# Patient Record
Sex: Male | Born: 2000
Health system: Southern US, Community
[De-identification: ages and names within clinical notes are randomized; demographics above are authoritative.]

## PROBLEM LIST (undated history)

## (undated) DIAGNOSIS — J45909 Unspecified asthma, uncomplicated: Secondary | ICD-10-CM

## (undated) DIAGNOSIS — A048 Other specified bacterial intestinal infections: Secondary | ICD-10-CM

## (undated) DIAGNOSIS — G43909 Migraine, unspecified, not intractable, without status migrainosus: Secondary | ICD-10-CM

## (undated) HISTORY — PX: TONSILLECTOMY: SUR1361

---
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
## 2019-03-13 ENCOUNTER — Other Ambulatory Visit: Payer: Self-pay

## 2019-03-13 ENCOUNTER — Encounter (HOSPITAL_BASED_OUTPATIENT_CLINIC_OR_DEPARTMENT_OTHER): Payer: Self-pay | Admitting: *Deleted

## 2019-03-13 ENCOUNTER — Emergency Department (HOSPITAL_BASED_OUTPATIENT_CLINIC_OR_DEPARTMENT_OTHER)
Admission: EM | Admit: 2019-03-13 | Discharge: 2019-03-13 | Disposition: A | Discharge status: Home / Self Care | Payer: 59 | Attending: Emergency Medicine | Admitting: Emergency Medicine

## 2019-03-13 DIAGNOSIS — E860 Dehydration: Secondary | ICD-10-CM | POA: Diagnosis not present

## 2019-03-13 DIAGNOSIS — U071 COVID-19: Secondary | ICD-10-CM | POA: Insufficient documentation

## 2019-03-13 DIAGNOSIS — Z79899 Other long term (current) drug therapy: Secondary | ICD-10-CM | POA: Insufficient documentation

## 2019-03-13 DIAGNOSIS — G43109 Migraine with aura, not intractable, without status migrainosus: Secondary | ICD-10-CM | POA: Diagnosis not present

## 2019-03-13 DIAGNOSIS — R112 Nausea with vomiting, unspecified: Secondary | ICD-10-CM | POA: Diagnosis not present

## 2019-03-13 LAB — URINALYSIS, ROUTINE W REFLEX MICROSCOPIC
Bilirubin Urine: NEGATIVE
Glucose, UA: NEGATIVE mg/dL
Hgb urine dipstick: NEGATIVE
Ketones, ur: 15 mg/dL — AB
Leukocytes,Ua: NEGATIVE
Nitrite: NEGATIVE
Protein, ur: NEGATIVE mg/dL
Specific Gravity, Urine: >1.03 — ABNORMAL HIGH (ref 1.005–1.030)
pH: 6 (ref 5.0–8.0)

## 2019-03-13 LAB — CBC WITH DIFFERENTIAL/PLATELET
Abs Immature Granulocytes: 0.02 10*3/uL (ref 0.00–0.07)
Basophils Absolute: 0 10*3/uL (ref 0.0–0.1)
Basophils Relative: 0 %
Eosinophils Absolute: 0.2 10*3/uL (ref 0.0–0.5)
Eosinophils Relative: 2 %
HCT: 39.3 % (ref 39.0–52.0)
Hemoglobin: 12.7 g/dL — ABNORMAL LOW (ref 13.0–17.0)
Immature Granulocytes: 0 %
Lymphocytes Relative: 19 %
Lymphs Abs: 1.6 10*3/uL (ref 0.7–4.0)
MCH: 27.8 pg (ref 26.0–34.0)
MCHC: 32.3 g/dL (ref 30.0–36.0)
MCV: 86 fL (ref 80.0–100.0)
Monocytes Absolute: 1 10*3/uL (ref 0.1–1.0)
Monocytes Relative: 12 %
Neutro Abs: 5.9 10*3/uL (ref 1.7–7.7)
Neutrophils Relative %: 67 %
Platelets: 213 10*3/uL (ref 150–400)
RBC: 4.57 MIL/uL (ref 4.22–5.81)
RDW: 12.1 % (ref 11.5–15.5)
WBC: 8.7 10*3/uL (ref 4.0–10.5)
nRBC: 0 % (ref 0.0–0.2)

## 2019-03-13 LAB — COMPREHENSIVE METABOLIC PANEL
ALT: 11 U/L (ref 0–44)
AST: 16 U/L (ref 15–41)
Albumin: 4.8 g/dL (ref 3.5–5.0)
Alkaline Phosphatase: 74 U/L (ref 38–126)
Anion gap: 10 (ref 5–15)
BUN: 14 mg/dL (ref 6–20)
CO2: 27 mmol/L (ref 22–32)
Calcium: 9.6 mg/dL (ref 8.9–10.3)
Chloride: 101 mmol/L (ref 98–111)
Creatinine, Ser: 0.86 mg/dL (ref 0.61–1.24)
GFR calc Af Amer: >60 mL/min (ref >60)
GFR calc non Af Amer: >60 mL/min (ref >60)
Glucose, Bld: 101 mg/dL — ABNORMAL HIGH (ref 70–99)
Potassium: 3.6 mmol/L (ref 3.5–5.1)
Sodium: 138 mmol/L (ref 135–145)
Total Bilirubin: 0.8 mg/dL (ref 0.3–1.2)
Total Protein: 7.6 g/dL (ref 6.5–8.1)

## 2019-03-13 LAB — SEDIMENTATION RATE: Sed Rate: 8 mm/hr (ref 0–16)

## 2019-03-13 LAB — RAPID URINE DRUG SCREEN, HOSP PERFORMED
Amphetamines: NOT DETECTED
Barbiturates: NOT DETECTED
Benzodiazepines: NOT DETECTED
Cocaine: NOT DETECTED
Opiates: NOT DETECTED
Tetrahydrocannabinol: NOT DETECTED

## 2019-03-13 LAB — GROUP A STREP BY PCR: Group A Strep by PCR: NOT DETECTED

## 2019-03-13 MED ORDER — MAGNESIUM SULFATE 2 GM/50ML IV SOLN
2.0000 g | Freq: Once | INTRAVENOUS | Status: AC
  Administered 2019-03-13: 2 g via INTRAVENOUS
  Filled 2019-03-13: qty 50

## 2019-03-13 MED ORDER — ONDANSETRON HCL 4 MG/2ML IJ SOLN
4.0000 mg | Freq: Once | INTRAMUSCULAR | Status: AC
  Administered 2019-03-13: 4 mg via INTRAVENOUS
  Filled 2019-03-13: qty 2

## 2019-03-13 MED ORDER — LACTATED RINGERS IV BOLUS
1000.0000 mL | Freq: Once | INTRAVENOUS | Status: AC
  Administered 2019-03-13: 1000 mL via INTRAVENOUS

## 2019-03-13 MED ORDER — SODIUM CHLORIDE 0.9 % IV SOLN
1000.0000 mL | INTRAVENOUS | Status: DC
  Administered 2019-03-13: 1000 mL via INTRAVENOUS

## 2019-03-13 MED ORDER — ONDANSETRON 4 MG PO TBDP: 4.0000 mg | ORAL_TABLET | Freq: Three times a day (TID) | ORAL | 0 refills | Status: DC | PRN

## 2019-03-13 MED ORDER — DIPHENHYDRAMINE HCL 50 MG/ML IJ SOLN
25.0000 mg | Freq: Once | INTRAMUSCULAR | Status: AC
  Administered 2019-03-13: 25 mg via INTRAVENOUS
  Filled 2019-03-13: qty 1

## 2019-03-13 MED ORDER — METOCLOPRAMIDE HCL 5 MG/ML IJ SOLN
10.0000 mg | Freq: Once | INTRAMUSCULAR | Status: AC
  Administered 2019-03-13: 10 mg via INTRAVENOUS
  Filled 2019-03-13: qty 2

## 2019-03-13 MED ORDER — SODIUM CHLORIDE 0.9 % IV BOLUS
1000.0000 mL | Freq: Once | INTRAVENOUS | Status: AC
  Administered 2019-03-13: 1000 mL via INTRAVENOUS

## 2019-03-13 MED ORDER — DEXAMETHASONE SODIUM PHOSPHATE 10 MG/ML IJ SOLN
10.0000 mg | Freq: Once | INTRAMUSCULAR | Status: AC
  Administered 2019-03-13: 10 mg via INTRAVENOUS
  Filled 2019-03-13: qty 1

## 2019-03-13 MED ORDER — SODIUM CHLORIDE 0.9 % IV BOLUS (SEPSIS)
1000.0000 mL | Freq: Once | INTRAVENOUS | Status: AC
  Administered 2019-03-13: 1000 mL via INTRAVENOUS

## 2019-03-13 MED ORDER — KETOROLAC TROMETHAMINE 30 MG/ML IJ SOLN
30.0000 mg | Freq: Once | INTRAMUSCULAR | Status: AC
  Administered 2019-03-13: 13:00:00 30 mg via INTRAVENOUS
  Filled 2019-03-13: qty 1

## 2019-03-13 NOTE — ED Provider Notes (Signed)
Pt was signed out by Dr. Johnney Killian pending symptomatic improvement.  Pt is finally feeling better after 3rd L of NS.  He is stable for d/c.  Return if worse.   Isla Pence, MD 03/13/19 1715

## 2019-03-13 NOTE — ED Triage Notes (Addendum)
Woke up yesterday with headache and constant pressure to epigastric area.    Has a history of migraine, took Ibu, Tylenol, Pepto bismol and gas-x with no relief. Vomited x4.  Tested positive for Covid in the end of June.

## 2019-03-13 NOTE — ED Provider Notes (Signed)
[facility] EMERGENCY DEPARTMENT Provider Note   CSN: [id number] Arrival date & time: 03/13/19  1043     History   Chief Complaint Chief Complaint  Patient presents with  . Migraine  . vomiting    HPI Devin Robinson is a 18 y.o. male.     HPI Patient reports he has a history of migraine headaches.  He reports he has gotten them sporadically for about 2 years now.  He reports he has a headache that started yesterday.  He reports that the severe pressure and throbbing through his forehead and temples.  He reports he has light sensitivity to his eyes which he commonly has with that.  He reports he is very nauseated as well.  Patient reports he also has pretty intense pressure in his epigastrium which does not typically occur with his migraines.  He reports it feels like somebody is putting a lot of pressure in his upper stomach.  He reports he is vomited about 4 times.  Reports if he tries to drink anything or take anything he ends up throwing up.  He denies he has had any fever with this.  He denies he has sore throat.  I do note ulcers on his lips.  He reports that he has had these on and off chronically since childhood.  He reports they are not as bad right now as they get sometimes.  Patient reports he did test positive for coronavirus about 4 weeks ago.  He reports he had some symptoms of achiness and fatigue and loss of taste.  He reports his symptoms however to completely resolved and he felt well and back to normal for 2 weeks up until the time that things started yesterday. Past Medical History:  Diagnosis Date  . Asthma     There are no active problems to display for this patient.   Past Surgical History:  Procedure Laterality Date  . TONSILLECTOMY          Home Medications    Prior to Admission medications   Medication Sig Start Date End Date Taking? Authorizing Provider  ergocalciferol (VITAMIN D2) 1.25 MG (50000 UT) capsule Take 50,000 Units by mouth  once a week.   Yes [provider]    Family History No family history on file.  Social History Social History   Tobacco Use  . Smoking status: Never Smoker  . Smokeless tobacco: Never Used  Substance Use Topics  . Alcohol use: No  . Drug use: Never     Allergies   Patient has no known allergies.   Review of Systems Review of Systems 10 Systems reviewed and are negative for acute change except as noted in the HPI.   Physical Exam Updated Vital Signs BP (!) 146/81 (BP Location: Right Arm)   Pulse 93   Temp (!) 97.5 F (36.4 C) (Oral)   Resp 16   Ht 5\' 10"  (1.778 m)   Wt 85.2 kg   SpO2 100%   BMI 26.96 kg/m   Physical Exam Constitutional:      Comments: Patient is alert and nontoxic.  He is keeping his sweatshirt over his eyes when I turn the lights on.  His mental status is clear.  He has no respiratory distress.  Well-nourished well-developed.  HENT:     Head: Normocephalic and atraumatic.     Right Ear: External ear normal.     Left Ear: External ear normal.     Nose: Nose normal.  Mouth/Throat:     Comments: Posterior oropharynx is patent.  No exudates on the tonsils.  Patient does have shallow ulcerative lesions on the lower lip.  There is a little cluster about 2 or 3 of them on the mucosal surface.  He does not have vesicular lesions.  There is also one small several millimeter shallow erosion on the tip of the tongue. Eyes:     Extraocular Movements: Extraocular movements intact.     Conjunctiva/sclera: Conjunctivae normal.     Pupils: Pupils are equal, round, and reactive to light.  Neck:     Musculoskeletal: Neck supple. No neck rigidity or muscular tenderness.  Cardiovascular:     Rate and Rhythm: Normal rate and regular rhythm.  Pulmonary:     Effort: Pulmonary effort is normal.     Breath sounds: Normal breath sounds.  Abdominal:     Comments: Abdomen is soft.  Patient feels discomfort to palpation in the epigastrium and upper  abdomen.  He does not have guarding or hepatosplenomegaly.  Lower abdomen nontender.  Musculoskeletal: Normal range of motion.        General: No swelling or tenderness.     Right lower leg: No edema.     Left lower leg: No edema.  Lymphadenopathy:     Cervical: No cervical adenopathy.  Skin:    General: Skin is warm and dry.     Findings: No rash.  Neurological:     General: No focal deficit present.     Mental Status: He is oriented to person, place, and time.     Coordination: Coordination normal.  Psychiatric:        Mood and Affect: Mood normal.      ED Treatments / Results  Labs (all labs ordered are listed, but only abnormal results are displayed) Labs Reviewed  NOVEL CORONAVIRUS, NAA (HOSPITAL ORDER, SEND-OUT TO REF LAB)  GROUP A STREP BY PCR  COMPREHENSIVE METABOLIC PANEL  CBC WITH DIFFERENTIAL/PLATELET  URINALYSIS, ROUTINE W REFLEX MICROSCOPIC  RAPID URINE DRUG SCREEN, HOSP PERFORMED  SEDIMENTATION RATE    EKG None  Radiology No results found.  Procedures Procedures (including critical care time)  Medications Ordered in ED Medications  sodium chloride 0.9 % bolus 1,000 mL (has no administration in time range)    Followed by  0.9 %  sodium chloride infusion (has no administration in time range)  ketorolac (TORADOL) 30 MG/ML injection 30 mg (has no administration in time range)  ondansetron (ZOFRAN) injection 4 mg (has no administration in time range)  ondansetron (ZOFRAN) injection 4 mg (4 mg Intravenous Given 03/13/19 1202)     Initial Impression / Assessment and Plan / ED Course  I have reviewed the triage vital signs and the nursing notes.  Pertinent labs & imaging results that were available during my care of the patient were reviewed by me and considered in my medical decision making (see chart for details).        Diagnostic evaluation unremarkable.  Patient is clinically well but continues to have recurrent nausea and vomiting.  Patient is  afebrile.  He did have COVID apparently 4 weeks ago but completely resolved symptoms.  I have low suspicion this is recurrence of COVID.  Patient does not have cough or shortness of breath.  He does not have general body aches or fever or meningismus.  Identifies symptoms is typical of migraine headaches that he has gotten previously.  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  I give some consideration to Behcet's syndrome as they do not appear herpetic.  Patient denies however other systemic type symptoms that would be more supportive. We will continue with hydration and migraine therapy.  Will need reassessment for final disposition.  If does not improve may need admission for symptomatic treatment.  Final Clinical Impressions(s) / ED Diagnoses   Final diagnoses:  None    ED Discharge Orders    None       Charlesetta Shanks, MD 03/13/19 1557

## 2019-03-15 LAB — NOVEL CORONAVIRUS, NAA (HOSP ORDER, SEND-OUT TO REF LAB; TAT 18-24 HRS): SARS-CoV-2, NAA: DETECTED — AB

## 2019-10-31 ENCOUNTER — Encounter (HOSPITAL_BASED_OUTPATIENT_CLINIC_OR_DEPARTMENT_OTHER): Payer: Self-pay | Admitting: Emergency Medicine

## 2019-10-31 ENCOUNTER — Other Ambulatory Visit: Payer: Self-pay

## 2019-10-31 ENCOUNTER — Emergency Department (HOSPITAL_BASED_OUTPATIENT_CLINIC_OR_DEPARTMENT_OTHER): Payer: 59

## 2019-10-31 ENCOUNTER — Emergency Department (HOSPITAL_BASED_OUTPATIENT_CLINIC_OR_DEPARTMENT_OTHER)
Admission: EM | Admit: 2019-10-31 | Discharge: 2019-10-31 | Disposition: A | Discharge status: Home / Self Care | Payer: 59 | Attending: Emergency Medicine | Admitting: Emergency Medicine

## 2019-10-31 DIAGNOSIS — Y999 Unspecified external cause status: Secondary | ICD-10-CM | POA: Insufficient documentation

## 2019-10-31 DIAGNOSIS — Y9367 Activity, basketball: Secondary | ICD-10-CM | POA: Diagnosis not present

## 2019-10-31 DIAGNOSIS — Y92310 Basketball court as the place of occurrence of the external cause: Secondary | ICD-10-CM | POA: Diagnosis not present

## 2019-10-31 DIAGNOSIS — S99911A Unspecified injury of right ankle, initial encounter: Secondary | ICD-10-CM | POA: Diagnosis present

## 2019-10-31 DIAGNOSIS — X509XXA Other and unspecified overexertion or strenuous movements or postures, initial encounter: Secondary | ICD-10-CM | POA: Insufficient documentation

## 2019-10-31 DIAGNOSIS — S93491A Sprain of other ligament of right ankle, initial encounter: Secondary | ICD-10-CM | POA: Insufficient documentation

## 2019-10-31 DIAGNOSIS — J45909 Unspecified asthma, uncomplicated: Secondary | ICD-10-CM | POA: Diagnosis not present

## 2019-10-31 MED ORDER — IBUPROFEN 800 MG PO TABS: 800.0000 mg | ORAL_TABLET | Freq: Three times a day (TID) | ORAL | 0 refills | Status: DC

## 2019-10-31 MED ORDER — ACETAMINOPHEN 500 MG PO TABS
1000.0000 mg | ORAL_TABLET | Freq: Once | ORAL | Status: AC
  Administered 2019-10-31: 23:00:00 1000 mg via ORAL
  Filled 2019-10-31: qty 2

## 2019-10-31 MED ORDER — IBUPROFEN 800 MG PO TABS
800.0000 mg | ORAL_TABLET | Freq: Once | ORAL | Status: AC
  Administered 2019-10-31: 800 mg via ORAL
  Filled 2019-10-31: qty 1

## 2019-10-31 NOTE — ED Triage Notes (Signed)
Felt a pop in right ankle playing basketball just PTA. Right ankle swollen, skin intact.Pain with bearing weight.

## 2019-10-31 NOTE — ED Provider Notes (Signed)
[facility] EMERGENCY DEPARTMENT Provider Note   CSN: [id number] Arrival date & time: 10/31/19  2250     History Chief Complaint  Patient presents with  . Ankle Injury    Devin Robinson is a 19 y.o. male.  The history is provided by the patient.  Ankle Injury This is a new problem. The current episode started less than 1 hour ago. The problem occurs constantly. The problem has not changed since onset.Pertinent negatives include no chest pain, no abdominal pain, no headaches and no shortness of breath. Nothing aggravates the symptoms. Nothing relieves the symptoms. He has tried nothing for the symptoms. The treatment provided no relief.       Past Medical History:  Diagnosis Date  . Asthma     There are no problems to display for this patient.   Past Surgical History:  Procedure Laterality Date  . TONSILLECTOMY         History reviewed. No pertinent family history.  Social History   Tobacco Use  . Smoking status: Never Smoker  . Smokeless tobacco: Never Used  Substance Use Topics  . Alcohol use: No  . Drug use: Never    Home Medications Prior to Admission medications   Medication Sig Start Date End Date Taking? Authorizing Provider  albuterol (VENTOLIN HFA) 108 (90 Base) MCG/ACT inhaler Inhale into the lungs. 05/22/19  Yes [provider]  ergocalciferol (VITAMIN D2) 1.25 MG (50000 UT) capsule Take 50,000 Units by mouth once a week.    [provider]  ibuprofen (ADVIL) 800 MG tablet Take 1 tablet (800 mg total) by mouth 3 (three) times daily. 10/31/19   Ramie Palladino, MD  ondansetron (ZOFRAN ODT) 4 MG disintegrating tablet Take 1 tablet (4 mg total) by mouth every 8 (eight) hours as needed. 03/13/19   Isla Pence, MD    Allergies    Patient has no known allergies.  Review of Systems   Review of Systems  Constitutional: Negative for fever.  HENT: Negative for congestion.   Eyes: Negative for visual disturbance.    Respiratory: Negative for shortness of breath.   Cardiovascular: Negative for chest pain.  Gastrointestinal: Negative for abdominal pain.  Genitourinary: Negative for difficulty urinating.  Musculoskeletal: Positive for arthralgias.  Neurological: Negative for headaches.  Psychiatric/Behavioral: Negative for agitation.  All other systems reviewed and are negative.   Physical Exam Updated Vital Signs BP (!) 151/77 (BP Location: Right Arm)   Pulse 88   Temp 98.2 F (36.8 C) (Oral)   Resp 20   Ht 5\' 10"  (1.778 m)   Wt 88.5 kg   SpO2 99%   BMI 27.98 kg/m   Physical Exam Vitals and nursing note reviewed.  Constitutional:      General: He is not in acute distress.    Appearance: Normal appearance.  HENT:     Head: Normocephalic and atraumatic.     Nose: Nose normal.  Eyes:     Conjunctiva/sclera: Conjunctivae normal.     Pupils: Pupils are equal, round, and reactive to light.  Cardiovascular:     Rate and Rhythm: Normal rate and regular rhythm.     Pulses: Normal pulses.     Heart sounds: Normal heart sounds.  Pulmonary:     Effort: Pulmonary effort is normal.     Breath sounds: Normal breath sounds.  Abdominal:     General: Abdomen is flat. Bowel sounds are normal.     Tenderness: There is no abdominal tenderness. There is no  guarding.  Musculoskeletal:        General: Normal range of motion.     Cervical back: Normal range of motion and neck supple.     Right lower leg: Normal.     Right ankle: Normal.     Right Achilles Tendon: Normal.     Left ankle: Normal.     Left Achilles Tendon: Normal.     Right foot: Normal.     Left foot: Normal.  Skin:    General: Skin is warm and dry.     Capillary Refill: Capillary refill takes less than 2 seconds.  Neurological:     General: No focal deficit present.     Mental Status: He is alert and oriented to person, place, and time.     Deep Tendon Reflexes: Reflexes normal.  Psychiatric:        Mood and Affect: Mood  normal.        Behavior: Behavior normal.     ED Results / Procedures / Treatments   Labs (all labs ordered are listed, but only abnormal results are displayed) Labs Reviewed - No data to display  EKG None  Radiology DG Ankle Complete Right  Result Date: 10/31/2019 CLINICAL DATA:  19 year old male with right ankle pain. EXAM: RIGHT ANKLE - COMPLETE 3+ VIEW COMPARISON:  None. FINDINGS: There is no acute fracture or dislocation. The bones are well mineralized. No arthritic change. The ankle mortise is intact. There is soft tissue swelling over the lateral malleolus. No radiopaque foreign object or soft tissue gas. IMPRESSION: No acute osseous pathology. Electronically Signed   By: Elgie Collard M.D.   On: 10/31/2019 23:25    Procedures Procedures (including critical care time)  Medications Ordered in ED Medications  acetaminophen (TYLENOL) tablet 1,000 mg (1,000 mg Oral Given 10/31/19 2320)  ibuprofen (ADVIL) tablet 800 mg (800 mg Oral Given 10/31/19 2320)    ED Course  I have reviewed the triage vital signs and the nursing notes.  Pertinent labs & imaging results that were available during my care of the patient were reviewed by me and considered in my medical decision making (see chart for details).    Sprain.  Ice elevation and NSAIDs.  ACE wrap applied in the department.  Wear a tight fitting shoe and elevate the leg when not on it.    Devin Robinson was evaluated in Emergency Department on 10/31/2019 for the symptoms described in the history of present illness. He was evaluated in the context of the global COVID-19 pandemic, which necessitated consideration that the patient might be at risk for infection with the SARS-CoV-2 virus that causes COVID-19. Institutional protocols and algorithms that pertain to the evaluation of patients at risk for COVID-19 are in a state of rapid change based on information released by regulatory bodies including the CDC and federal and state  organizations. These policies and algorithms were followed during the patient's care in the ED.  Final Clinical Impression(s) / ED Diagnoses Final diagnoses:  Sprain of anterior talofibular ligament of right ankle, initial encounter    Return for weakness, numbness, changes in vision or speech, fevers >100.4 unrelieved by medication, shortness of breath, intractable vomiting, or diarrhea, abdominal pain, Inability to tolerate liquids or food, cough, altered mental status or any concerns. No signs of systemic illness or infection. The patient is nontoxic-appearing on exam and vital signs are within normal limits.   I have reviewed the triage vital signs and the nursing notes. Pertinent labs &imaging results  that were available during my care of the patient were reviewed by me and considered in my medical decision making (see chart for details).  After history, exam, and medical workup I feel the patient has been appropriately medically screened and is safe for discharge home. Pertinent diagnoses were discussed with the patient. Patient was given return precautions     Rx / DC Orders ED Discharge Orders         Ordered    ibuprofen (ADVIL) 800 MG tablet  3 times daily     10/31/19 2339           Jabin Tapp, MD 10/31/19 2341

## 2019-11-05 ENCOUNTER — Other Ambulatory Visit: Payer: Self-pay

## 2019-11-06 ENCOUNTER — Encounter: Payer: Self-pay | Admitting: Family Medicine

## 2019-11-06 ENCOUNTER — Ambulatory Visit: Payer: 59 | Admitting: Family Medicine

## 2019-11-06 ENCOUNTER — Other Ambulatory Visit: Payer: Self-pay

## 2019-11-06 VITALS — BP 132/82 | HR 62 | Temp 97.1°F | Ht 70.0 in | Wt 194.2 lb

## 2019-11-06 DIAGNOSIS — G43809 Other migraine, not intractable, without status migrainosus: Secondary | ICD-10-CM | POA: Diagnosis not present

## 2019-11-06 DIAGNOSIS — J45990 Exercise induced bronchospasm: Secondary | ICD-10-CM

## 2019-11-06 MED ORDER — FLUTICASONE PROPIONATE HFA 110 MCG/ACT IN AERO: 2.0000 | INHALATION_SPRAY | Freq: Two times a day (BID) | RESPIRATORY_TRACT | 2 refills | Status: DC

## 2019-11-06 MED ORDER — LEVALBUTEROL TARTRATE 45 MCG/ACT IN AERO: 1.0000 | INHALATION_SPRAY | Freq: Four times a day (QID) | RESPIRATORY_TRACT | 12 refills | Status: DC | PRN

## 2019-11-06 MED ORDER — SUMATRIPTAN SUCCINATE 100 MG PO TABS: 50.0000 mg | ORAL_TABLET | ORAL | 2 refills | Status: DC | PRN

## 2019-11-06 NOTE — Progress Notes (Signed)
Chief Complaint  Patient presents with  . New Patient (Initial Visit)  . Asthma       New Patient Visit SUBJECTIVE: HPI: Devin Robinson is an 19 y.o.male who is being seen for establishing care.  The patient was previously seen at the pediatrician's office.  Patient was diagnosed with exercise-induced asthma.  An albuterol inhaler was sent and that did not help.  He tried using it prophylactically and that also was not helpful.  He has side effects of lightheadedness and headaches with it.  He has never tried anything else.  He denies strong allergies.  No current chest pain, shortness of breath or wheezing.  Patient has a history of migraines.  They appear to be triggered by exercise.  He uses ibuprofen.  He does have a family history of headaches.  Past Medical History:  Diagnosis Date  . Asthma    Past Surgical History:  Procedure Laterality Date  . TONSILLECTOMY     History reviewed. No pertinent family history. No Known Allergies  Current Outpatient Medications:  .  fluticasone (FLOVENT HFA) 110 MCG/ACT inhaler, Inhale 2 puffs into the lungs in the morning and at bedtime. Rinse mouth out after each use., Disp: 1 Inhaler, Rfl: 2 .  levalbuterol (XOPENEX HFA) 45 MCG/ACT inhaler, Inhale 1-2 puffs into the lungs every 6 (six) hours as needed for wheezing or shortness of breath., Disp: 1 Inhaler, Rfl: 12 .  SUMAtriptan (IMITREX) 100 MG tablet, Take 0.5-1 tablets (50-100 mg total) by mouth every 2 (two) hours as needed for migraine. May repeat in 2 hours if headache persists or recurs., Disp: 10 tablet, Rfl: 2  OBJECTIVE: BP 132/82 (BP Location: Left Arm, Patient Position: Sitting, Cuff Size: Normal)   Pulse 62   Temp (!) 97.1 F (36.2 C) (Temporal)   Ht 5\' 10"  (1.778 m)   Wt 194 lb 4 oz (88.1 kg)   SpO2 98%   BMI 27.87 kg/m  General:  well developed, well nourished, in no apparent distress Skin:  no significant moles, warts, or growths Lungs:  clear to auscultation,  breath sounds equal bilaterally, no respiratory distress Cardio:  regular rate and rhythm, no LE edema Musculoskeletal:  symmetrical muscle groups noted without atrophy or deformity Neuro:  gait normal, DTRs equal and symmetric throughout, no clonus, no cerebellar signs Psych: well oriented with normal range of affect and appropriate judgment/insight  ASSESSMENT/PLAN: Exercise-induced asthma - Plan: levalbuterol (XOPENEX HFA) 45 MCG/ACT inhaler, fluticasone (FLOVENT HFA) 110 MCG/ACT inhaler  Other migraine without status migrainosus, not intractable - Plan: SUMAtriptan (IMITREX) 100 MG tablet  1-change albuterol to Xopenex.  Add inhaled corticosteroid.  Reminded him to rinse mouth out after each use. 2-trial Imitrex.  Could consider Nurtec. Patient should return in 1 month to recheck. The patient voiced understanding and agreement to the plan.   [place], DO 11/06/19  12:17 PM

## 2019-11-06 NOTE — Patient Instructions (Addendum)
Nice meeting you today.  Use the new rescue inhaler as needed. Consider using 10-15 min prior to exercise  Stay hydrated.   No more ibuprofen before working out/sports.  Try 1/2 tab of the Imitrex first. Can make you drowsy.  Let us know if you need anything.

## 2019-12-19 ENCOUNTER — Ambulatory Visit: Payer: 59 | Admitting: Family Medicine

## 2020-01-28 ENCOUNTER — Other Ambulatory Visit: Payer: Self-pay

## 2020-01-28 ENCOUNTER — Encounter (HOSPITAL_BASED_OUTPATIENT_CLINIC_OR_DEPARTMENT_OTHER): Payer: Self-pay | Admitting: Emergency Medicine

## 2020-01-28 ENCOUNTER — Emergency Department (HOSPITAL_BASED_OUTPATIENT_CLINIC_OR_DEPARTMENT_OTHER)
Admission: EM | Admit: 2020-01-28 | Discharge: 2020-01-28 | Disposition: A | Discharge status: Home / Self Care | Payer: 59 | Attending: Emergency Medicine | Admitting: Emergency Medicine

## 2020-01-28 DIAGNOSIS — J45909 Unspecified asthma, uncomplicated: Secondary | ICD-10-CM | POA: Diagnosis not present

## 2020-01-28 DIAGNOSIS — H53149 Visual discomfort, unspecified: Secondary | ICD-10-CM | POA: Diagnosis not present

## 2020-01-28 DIAGNOSIS — G43809 Other migraine, not intractable, without status migrainosus: Secondary | ICD-10-CM | POA: Insufficient documentation

## 2020-01-28 DIAGNOSIS — Z7952 Long term (current) use of systemic steroids: Secondary | ICD-10-CM | POA: Insufficient documentation

## 2020-01-28 HISTORY — DX: Migraine, unspecified, not intractable, without status migrainosus: G43.909

## 2020-01-28 MED ORDER — KETOROLAC TROMETHAMINE 60 MG/2ML IM SOLN
INTRAMUSCULAR | Status: AC
  Administered 2020-01-28: 30 mg
  Filled 2020-01-28: qty 2

## 2020-01-28 MED ORDER — KETOROLAC TROMETHAMINE 30 MG/ML IJ SOLN: 30.0000 mg | Freq: Once | INTRAMUSCULAR | Status: DC

## 2020-01-28 MED ORDER — SODIUM CHLORIDE 0.9 % IV BOLUS
1000.0000 mL | Freq: Once | INTRAVENOUS | Status: AC
  Administered 2020-01-28: 1000 mL via INTRAVENOUS

## 2020-01-28 MED ORDER — DIPHENHYDRAMINE HCL 50 MG/ML IJ SOLN
25.0000 mg | Freq: Once | INTRAMUSCULAR | Status: AC
  Administered 2020-01-28: 25 mg via INTRAVENOUS
  Filled 2020-01-28: qty 1

## 2020-01-28 MED ORDER — ONDANSETRON HCL 4 MG/2ML IJ SOLN
4.0000 mg | Freq: Once | INTRAMUSCULAR | Status: AC
  Administered 2020-01-28: 4 mg via INTRAVENOUS
  Filled 2020-01-28: qty 2

## 2020-01-28 MED ORDER — METOCLOPRAMIDE HCL 5 MG/ML IJ SOLN
10.0000 mg | Freq: Once | INTRAMUSCULAR | Status: AC
  Administered 2020-01-28: 10 mg via INTRAVENOUS
  Filled 2020-01-28: qty 2

## 2020-01-28 NOTE — ED Notes (Signed)
Pt asleep. RN woke pt to assess HA. Pt denies pain, endorses dizziness. Pts father states he has had similar reaction to meds given tonight in the past that have resolved with sleep.

## 2020-01-28 NOTE — ED Triage Notes (Signed)
Patient presents with complaints of migraine headache; states onset 4 hours pta. States took rx and otc ibuprofen at home with no relief. States vomited x 2 and states photophobia.

## 2020-01-28 NOTE — ED Provider Notes (Signed)
[facility] EMERGENCY DEPARTMENT Provider Note   CSN: [id number] Arrival date & time: 01/28/20  0045     History Chief Complaint  Patient presents with  . Headache    Devin Robinson is a 19 y.o. male.  Patient presents with typical migraine headache that onset about 8 PM.  States headache is bitemporal and retro-orbital and progressively worsening.  He took his sumatriptan at home without relief.  Headache is associated with nausea, photophobia and 2 episodes of vomiting.  Denies any fever, chills.  No focal weakness, numbness or tingling.  No chest pain or shortness of breath.  Denies thunderclap onset.  Similar to previous migraines.  No fever. No recent illnesses.  The history is provided by the patient.  Headache Associated symptoms: nausea, photophobia and vomiting   Associated symptoms: no congestion, no dizziness, no fever, no numbness and no weakness        Past Medical History:  Diagnosis Date  . Asthma   . Migraine     There are no problems to display for this patient.   Past Surgical History:  Procedure Laterality Date  . TONSILLECTOMY         History reviewed. No pertinent family history.  Social History   Tobacco Use  . Smoking status: Never Smoker  . Smokeless tobacco: Never Used  Substance Use Topics  . Alcohol use: No  . Drug use: Never    Home Medications Prior to Admission medications   Medication Sig Start Date End Date Taking? Authorizing Provider  fluticasone (FLOVENT HFA) 110 MCG/ACT inhaler Inhale 2 puffs into the lungs in the morning and at bedtime. Rinse mouth out after each use. 11/06/19   Wendling, Crosby Oyster, DO  levalbuterol [facility] HFA) 45 MCG/ACT inhaler Inhale 1-2 puffs into the lungs every 6 (six) hours as needed for wheezing or shortness of breath. 11/06/19   Wendling, Crosby Oyster, DO  SUMAtriptan (IMITREX) 100 MG tablet Take 0.5-1 tablets (50-100 mg total) by mouth every 2 (two) hours as needed for  migraine. May repeat in 2 hours if headache persists or recurs. 11/06/19   Shelda Pal, DO    Allergies    Patient has no known allergies.  Review of Systems   Review of Systems  Constitutional: Negative for activity change, appetite change and fever.  HENT: Negative for congestion and rhinorrhea.   Eyes: Positive for photophobia.  Respiratory: Negative for chest tightness.   Cardiovascular: Negative for chest pain.  Gastrointestinal: Positive for nausea and vomiting.  Genitourinary: Negative for dysuria and hematuria.  Musculoskeletal: Negative for arthralgias.  Skin: Negative for rash.  Neurological: Positive for headaches. Negative for dizziness, weakness, light-headedness and numbness.   all other systems are negative except as noted in the HPI and PMH.    Physical Exam Updated Vital Signs BP (!) 151/88 (BP Location: Left Arm)   Pulse 82   Temp 97.9 F (36.6 C) (Oral)   Resp 18   Ht 5\' 10"  (1.778 m)   Wt 89.4 kg   SpO2 100%   BMI 28.27 kg/m   Physical Exam Vitals and nursing note reviewed.  Constitutional:      General: He is not in acute distress.    Appearance: He is well-developed.  HENT:     Head: Normocephalic and atraumatic.     Mouth/Throat:     Pharynx: No oropharyngeal exudate.  Eyes:     Conjunctiva/sclera: Conjunctivae normal.     Pupils: Pupils are equal, round, and reactive  to light.     Comments: Photophobic  Neck:     Comments: No meningismus. Cardiovascular:     Rate and Rhythm: Normal rate and regular rhythm.     Heart sounds: Normal heart sounds. No murmur heard.   Pulmonary:     Effort: Pulmonary effort is normal. No respiratory distress.     Breath sounds: Normal breath sounds.  Abdominal:     Palpations: Abdomen is soft.     Tenderness: There is no abdominal tenderness. There is no guarding or rebound.  Musculoskeletal:        General: No tenderness. Normal range of motion.     Cervical back: Normal range of motion and  neck supple. No rigidity.  Skin:    General: Skin is warm.     Capillary Refill: Capillary refill takes less than 2 seconds.  Neurological:     General: No focal deficit present.     Mental Status: He is alert and oriented to person, place, and time. Mental status is at baseline.     Cranial Nerves: No cranial nerve deficit.     Motor: No abnormal muscle tone.     Coordination: Coordination normal.     Comments: No ataxia on finger to nose bilaterally. No pronator drift. 5/5 strength throughout. CN 2-12 intact.Equal grip strength. Sensation intact.   Psychiatric:        Behavior: Behavior normal.     ED Results / Procedures / Treatments   Labs (all labs ordered are listed, but only abnormal results are displayed) Labs Reviewed - No data to display  EKG None  Radiology No results found.  Procedures Procedures (including critical care time)  Medications Ordered in ED Medications  sodium chloride 0.9 % bolus 1,000 mL (has no administration in time range)  metoCLOPramide (REGLAN) injection 10 mg (has no administration in time range)  diphenhydrAMINE (BENADRYL) injection 25 mg (has no administration in time range)  ketorolac (TORADOL) 30 MG/ML injection 30 mg (has no administration in time range)  ondansetron (ZOFRAN) injection 4 mg (has no administration in time range)    ED Course  I have reviewed the triage vital signs and the nursing notes.  Pertinent labs & imaging results that were available during my care of the patient were reviewed by me and considered in my medical decision making (see chart for details).    MDM Rules/Calculators/A&P                         Gradual onset headache similar previous migraines.  Associated with nausea and vomiting and photophobia.  No focal neurological deficit.   Low Suspicion for subarachnoid hemorrhage, meningitis, temporal arteritis.  Will hydrate and treat symptomatically  Patient feels improved after hydration and headache  medicines including Reglan, Toradol and Benadryl. He is tolerating p.o. and states his headache is resolved.  He states he is ready to go home.  Follow-up with PCP as well as neurology.  Return to the ED with worsening symptoms including atypical headache, sudden onset headache, neurological deficit, mental status change, fever, any other concerns. Final Clinical Impression(s) / ED Diagnoses Final diagnoses:  Other migraine without status migrainosus, not intractable    Rx / DC Orders ED Discharge Orders    None       Roy Tokarz, Jeannett Senior, MD 01/28/20 0405

## 2020-01-28 NOTE — Discharge Instructions (Signed)
Keep yourself hydrated.  Follow-up with your PCP as well as the neurologist.  Return to the ED if you develop new or worsening symptoms. Return to the ED if you develop a headache that is atypical for you, sudden onset, associate with vomiting, unilateral weakness, numbness, tingling, fever, confusion, any other concerns

## 2020-01-29 ENCOUNTER — Encounter: Payer: Self-pay | Admitting: Family Medicine

## 2020-01-29 ENCOUNTER — Ambulatory Visit: Payer: 59 | Admitting: Family Medicine

## 2020-01-29 VITALS — BP 128/72 | HR 60 | Temp 95.9°F | Ht 70.0 in | Wt 203.0 lb

## 2020-01-29 DIAGNOSIS — G43109 Migraine with aura, not intractable, without status migrainosus: Secondary | ICD-10-CM | POA: Diagnosis not present

## 2020-01-29 MED ORDER — RIZATRIPTAN BENZOATE 10 MG PO TABS: 10.0000 mg | ORAL_TABLET | ORAL | 0 refills | Status: DC | PRN

## 2020-01-29 NOTE — Patient Instructions (Addendum)
If you do not hear anything about your referral in the next 1-2 weeks, call our office and ask for an update.  Let me know if you want to see a heart doctor.  Try this new medication. Let me know if there are cost issues.   Let us know if you need anything.

## 2020-01-29 NOTE — Progress Notes (Signed)
Chief Complaint  Patient presents with  . Migraine    Subjective: Patient is a 19 y.o. male here for migraine f/u.  Went to ED for 2 d of migraines. B/l and frontal. Some neck stiffness. Gets sensitivity to light/sound, nausea, blurry vision. He does get an aura. Tried Imitrex that did not help. He was recommended to see Neuro, requesting something in the area.   Past Medical History:  Diagnosis Date  . Asthma   . Migraine     Objective: BP 128/72 (BP Location: Left Arm, Patient Position: Sitting, Cuff Size: Normal)   Pulse 60   Temp (!) 95.9 F (35.5 C) (Temporal)   Ht 5\' 10"  (1.778 m)   Wt 203 lb (92.1 kg)   SpO2 98%   BMI 29.13 kg/m  General: Awake, appears stated age HEENT: MMM, EOMi MSK: Mild ttp over the cervical parasp msc b/l; no ttp over frontal bone or temporal region b/l Neuro: No Cerebellar signs, DTR's equal and symmetric throughout, no clonus, 5/5 strength throughout Lungs: No accessory muscle use Psych: Age appropriate judgment and insight, normal affect and mood  Assessment and Plan: Migraine with aura and without status migrainosus, not intractable - Plan: Ambulatory referral to Neurology, rizatriptan (MAXALT) 10 MG tablet  Orders as above. Trial Maxalt instead of Imitrex. Could trial Nurtec pending insurance barriers.  F/u pending.  The patient voiced understanding and agreement to the plan.  [place], DO 01/29/20  3:42 PM

## 2020-02-20 IMAGING — DX PORTABLE CHEST - 1 VIEW
1 series · 1 of 1 positions shown · non-contrast
Comparison: None.

CLINICAL DATA: Migraine headache for 2 days.  Fever.

EXAM:
PORTABLE CHEST 1 VIEW

[chest ap]
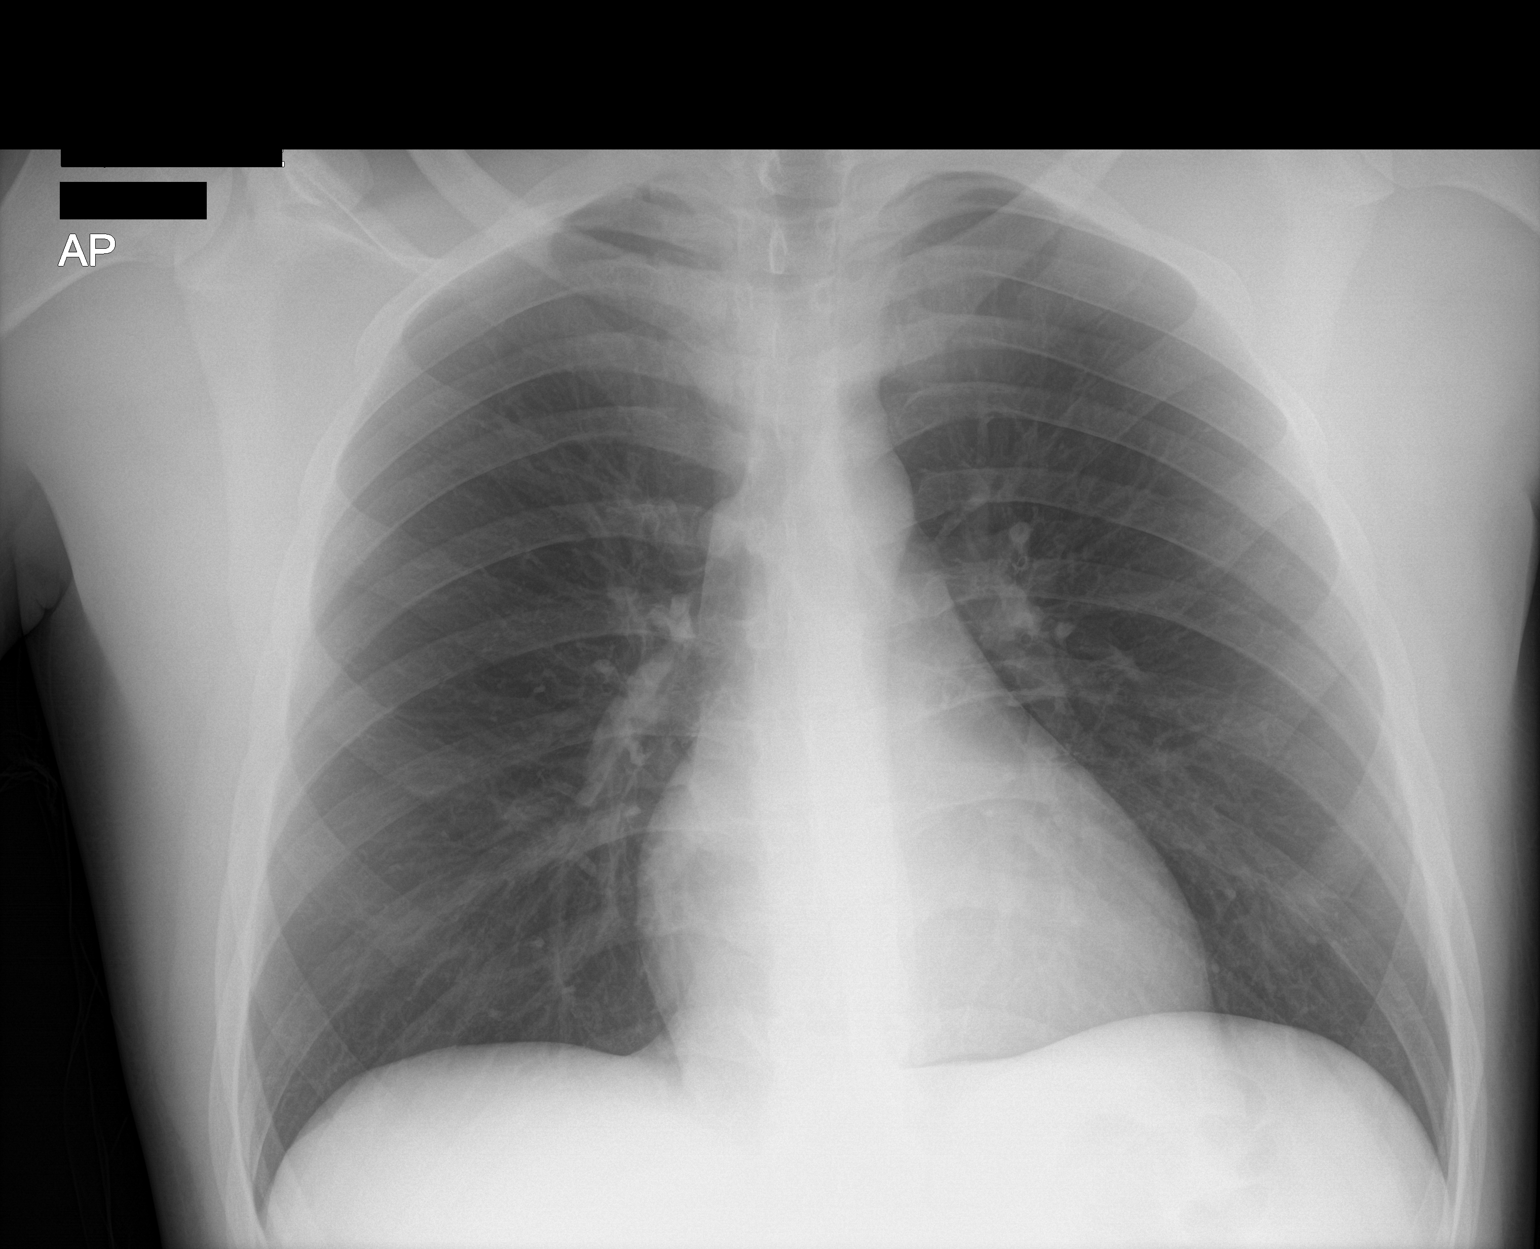

[1 of 1 positions shown; findings below may reference images not displayed]

FINDINGS: The heart size and mediastinal contours are within normal limits.
Both lungs are clear. The visualized skeletal structures are
unremarkable.
IMPRESSION: Negative one-view chest x-ray

## 2020-04-13 ENCOUNTER — Other Ambulatory Visit: Payer: Self-pay | Admitting: Family Medicine

## 2020-04-13 DIAGNOSIS — G43109 Migraine with aura, not intractable, without status migrainosus: Secondary | ICD-10-CM

## 2020-05-23 ENCOUNTER — Encounter: Payer: Self-pay | Admitting: Family Medicine

## 2020-05-23 ENCOUNTER — Other Ambulatory Visit: Payer: Self-pay

## 2020-05-23 ENCOUNTER — Ambulatory Visit: Payer: 59 | Admitting: Family Medicine

## 2020-05-23 VITALS — BP 128/68 | HR 101 | Temp 98.7°F | Ht 70.0 in | Wt 195.2 lb

## 2020-05-23 DIAGNOSIS — H9202 Otalgia, left ear: Secondary | ICD-10-CM | POA: Diagnosis not present

## 2020-05-23 MED ORDER — TRAMADOL HCL 50 MG PO TABS: 50.0000 mg | ORAL_TABLET | Freq: Three times a day (TID) | ORAL | 0 refills | Status: DC | PRN

## 2020-05-23 MED ORDER — CEFTRIAXONE SODIUM 1 G IJ SOLR
1.0000 g | Freq: Once | INTRAMUSCULAR | Status: AC
  Administered 2020-05-23: 1 g via INTRAMUSCULAR

## 2020-05-23 NOTE — Progress Notes (Signed)
Chief Complaint  Patient presents with   ear abcess    left ear    Devin Robinson is a 19 y.o. male here for a skin complaint.  Duration: 2 weeks Location: L ear Pruritic? No Painful? Yes Drainage? No New soaps/lotions/topicals/detergents? No Sick contacts? No  He does not wrestle and has had no inj.  Other associated symptoms: fluid returned after [facility] T[facility] at [facility] Therapies tried thus far: Bactrim, ibuprofen, Clindamycin; I&D did not yield anything  Past Medical History:  Diagnosis Date   Asthma    Migraine     BP 128/68 (BP Location: Left Arm, Patient Position: Sitting, Cuff Size: Normal)    Pulse (!) 101    Temp 98.7 F (37.1 C) (Oral)    Ht 5\' 10"  (1.778 m)    Wt 195 lb 4 oz (88.6 kg)    SpO2 98%    BMI 28.02 kg/m  Gen: awake, alert, appearing stated age Lungs: No accessory muscle use Skin: See below. Fluid appreciate in ear though no sig fluctuance, some induration of tissue Psych: Age appropriate judgment and insight   L ear  Left ear pain - Plan: Ambulatory referral to ENT, traMADol (ULTRAM) 50 MG tablet, cefTRIAXone (ROCEPHIN) injection 1 g  Rocephin today, finish Clindamycin. Tramadol 50 mg q 8 hrs prn breakthrough pain. Tylenol, ibuprofen, ice. Urgent referral to ENT.  F/u prn w me, ER if fevers or spreading redness.  The patient voiced understanding and agreement to the plan.  [place], DO 05/23/20 3:37 PM

## 2020-05-23 NOTE — Patient Instructions (Signed)
Finish the Clindamycin. If the shot does very well for you, let me know on Monday.  We put in an urgent referral to see if we can get you in sooner for this.   Do not drink alcohol, do any illicit/street drugs, drive or do anything that requires alertness while on this medicine.   OK to take Tylenol 1000 mg (2 extra strength tabs) or 975 mg (3 regular strength tabs) every 6 hours as needed.  Ice/cold pack over area for 10-15 min twice daily.  Ibuprofen 400-600 mg (2-3 over the counter strength tabs) every 6 hours as needed for pain.  Let us know if you need anything.

## 2020-05-27 MED ORDER — ACETAMINOPHEN-CODEINE #3 300-30 MG PO TABS: 1.0000 | ORAL_TABLET | Freq: Four times a day (QID) | ORAL | 0 refills | Status: DC | PRN

## 2020-05-29 ENCOUNTER — Emergency Department (HOSPITAL_BASED_OUTPATIENT_CLINIC_OR_DEPARTMENT_OTHER)
Admission: EM | Admit: 2020-05-29 | Discharge: 2020-05-29 | Disposition: A | Discharge status: Home / Self Care | Payer: 59 | Attending: Emergency Medicine | Admitting: Emergency Medicine

## 2020-05-29 ENCOUNTER — Encounter (HOSPITAL_BASED_OUTPATIENT_CLINIC_OR_DEPARTMENT_OTHER): Payer: Self-pay | Admitting: *Deleted

## 2020-05-29 ENCOUNTER — Other Ambulatory Visit: Payer: Self-pay

## 2020-05-29 DIAGNOSIS — J45909 Unspecified asthma, uncomplicated: Secondary | ICD-10-CM | POA: Diagnosis not present

## 2020-05-29 DIAGNOSIS — H938X2 Other specified disorders of left ear: Secondary | ICD-10-CM | POA: Diagnosis not present

## 2020-05-29 DIAGNOSIS — H9202 Otalgia, left ear: Secondary | ICD-10-CM | POA: Diagnosis present

## 2020-05-29 NOTE — ED Triage Notes (Signed)
Left ear pain x 3 weeks. He has taken 2 rounds of oral antibiotics and an injection. He had an I&D by an ENT. He is here for pain control.

## 2020-05-29 NOTE — ED Provider Notes (Signed)
[facility] EMERGENCY DEPARTMENT Provider Note   CSN: [id number] Arrival date & time: 05/29/20  1204     History Chief Complaint  Patient presents with  . Recurrent Skin Infections    Devin Robinson is a 19 y.o. male.  HPI      Devin Robinson is a 19 y.o. male, with a history of asthma and migraine, presenting to the ED with swelling and pain to the left ear. He states he has had this issue for the last 2 weeks. He has had I&D performed twice, once in urgent care and once at Dr. Kathi Der office (ENT). This last I&D was performed 05/26/20.  He has been treated with first Bactrim and then clindamycin.  Finished his course of clindamycin yesterday.  Since his I&D on October 11, swelling has begun to recur along with increasing pain.  Denies fever, facial swelling, lymphadenopathy, dizziness, syncope, hearing abnormalities, headaches, pain inside the ear, or any other complaints.   Past Medical History:  Diagnosis Date  . Asthma   . Migraine     There are no problems to display for this patient.   Past Surgical History:  Procedure Laterality Date  . TONSILLECTOMY         No family history on file.  Social History   Tobacco Use  . Smoking status: Never Smoker  . Smokeless tobacco: Never Used  Substance Use Topics  . Alcohol use: No  . Drug use: Never    Home Medications Prior to Admission medications   Medication Sig Start Date End Date Taking? Authorizing Provider  acetaminophen-codeine (TYLENOL #3) 300-30 MG tablet Take 1 tablet by mouth every 6 (six) hours as needed for moderate pain. 05/27/20  Yes Sharlene Dory, DO  levalbuterol [facility] HFA) 45 MCG/ACT inhaler Inhale 1-2 puffs into the lungs every 6 (six) hours as needed for wheezing or shortness of breath. 11/06/19  Yes Wendling, Jilda Roche, DO  rizatriptan (MAXALT) 10 MG tablet TAKE 1 TAB BY MOUTH AS NEEDED FOR MIGRAINE. MAY REPEAT IN 2 HRS IF NEEDED 04/14/20  Yes Sharlene Dory, DO    Allergies    Patient has no known allergies.  Review of Systems   Review of Systems  Constitutional: Negative for chills and fever.  HENT: Negative for ear discharge and facial swelling.        Outer ear pain and swelling  Cardiovascular: Negative for chest pain.  Gastrointestinal: Negative for nausea and vomiting.  Musculoskeletal: Negative for neck pain.  Neurological: Negative for dizziness and headaches.  All other systems reviewed and are negative.   Physical Exam Updated Vital Signs BP (!) 161/84   Pulse 78   Temp 99 F (37.2 C) (Oral)   Resp 20   Ht 5\' 10"  (1.778 m)   Wt 88.6 kg   SpO2 98%   BMI 28.03 kg/m   Physical Exam Vitals and nursing note reviewed.  Constitutional:      General: He is not in acute distress.    Appearance: He is well-developed. He is not diaphoretic.  HENT:     Head: Normocephalic and atraumatic.     Right Ear: External ear normal.     Left Ear: Tympanic membrane and ear canal normal.     Ears:     Comments: Swelling to the superior left auricle, as shown in the pictures.  Tender to the touch. No other tenderness or swelling to the ear.  No swelling, tenderness, or color change to the face,  scalp, or neck. No lymphadenopathy.    Mouth/Throat:     Mouth: Mucous membranes are moist.     Pharynx: Oropharynx is clear.  Eyes:     Conjunctiva/sclera: Conjunctivae normal.  Cardiovascular:     Rate and Rhythm: Normal rate and regular rhythm.     Pulses: Normal pulses.          Radial pulses are 2+ on the right side and 2+ on the left side.  Pulmonary:     Effort: Pulmonary effort is normal. No respiratory distress.  Abdominal:     Tenderness: There is no guarding.  Musculoskeletal:     Cervical back: Neck supple.  Lymphadenopathy:     Cervical: No cervical adenopathy.  Skin:    General: Skin is warm and dry.  Neurological:     Mental Status: He is alert.  Psychiatric:        Mood and Affect: Mood and affect  normal.        Speech: Speech normal.        Behavior: Behavior normal.                  ED Results / Procedures / Treatments   Labs (all labs ordered are listed, but only abnormal results are displayed) Labs Reviewed - No data to display  EKG None  Radiology No results found.  Procedures Procedures (including critical care time)  Medications Ordered in ED Medications - No data to display  ED Course  I have reviewed the triage vital signs and the nursing notes.  Pertinent labs & imaging results that were available during my care of the patient were reviewed by me and considered in my medical decision making (see chart for details).  Clinical Course as of May 30 1855  Thu May 29, 2020  1450 Spoke with Dr. Christell Constant, ENT, who treated the patient in his office on October 11. We discussed the patient's current symptoms and physical exam.  He advised for [place] to discharge him and send the patient to his office POV.   [SJ]    Clinical Course User Index [SJ] Gerri Acre, Hillard Danker, PA-C   MDM Rules/Calculators/A&P                          Patient presents with worsening swelling and pain to the left ear. He does not have evidence of decreased circulation. Patient was discharged from here with instructions to proceed directly to the ENT office.  His mother transported the patient.     Final Clinical Impression(s) / ED Diagnoses Final diagnoses:  Swelling of left ear    Rx / DC Orders ED Discharge Orders    None       Concepcion Living 05/29/20 1856    Tilden Fossa, MD 06/05/20 [phone]

## 2020-05-29 NOTE — Discharge Instructions (Signed)
Proceed to Dr. Kathi Der office immediately after discharge.

## 2020-07-09 ENCOUNTER — Ambulatory Visit: Payer: 59 | Admitting: Family Medicine

## 2020-08-22 ENCOUNTER — Other Ambulatory Visit: Payer: Self-pay

## 2020-08-22 ENCOUNTER — Ambulatory Visit: Payer: 59 | Admitting: Family Medicine

## 2020-08-22 ENCOUNTER — Encounter: Payer: Self-pay | Admitting: Family Medicine

## 2020-08-22 VITALS — BP 132/72 | HR 105 | Temp 98.4°F | Ht 70.0 in | Wt 195.2 lb

## 2020-08-22 DIAGNOSIS — G43109 Migraine with aura, not intractable, without status migrainosus: Secondary | ICD-10-CM | POA: Diagnosis not present

## 2020-08-22 MED ORDER — KETOROLAC TROMETHAMINE 60 MG/2ML IM SOLN
60.0000 mg | Freq: Once | INTRAMUSCULAR | Status: AC
  Administered 2020-08-22: 60 mg via INTRAMUSCULAR

## 2020-08-22 MED ORDER — AMITRIPTYLINE HCL 25 MG PO TABS: 25.0000 mg | ORAL_TABLET | Freq: Every day | ORAL | 2 refills | Status: DC

## 2020-08-22 MED ORDER — NURTEC 75 MG PO TBDP: ORAL_TABLET | ORAL | 3 refills | Status: DC

## 2020-08-22 NOTE — Progress Notes (Signed)
/   Chief Complaint  Patient presents with  . Migraine    Subjective: Patient is a 20 y.o. male here for f/u migraines.  Patient has a several year history of migraines.  He is currently taking Maxalt as needed.  He is not on a prophylactic and has never been on one.  He has failed Imitrex.  Over the past month, he had a bad migraine and has been having daily headaches ever since.  They're bilateral over the temples.  He has a mild 1 rated at 6/10 right now.  He gets some nausea.  No vision changes, balance issues, difficulty with speech, or trouble swallowing.   Past Medical History:  Diagnosis Date  . Asthma   . Migraine     Objective: BP 132/72 (BP Location: Left Arm, Patient Position: Sitting, Cuff Size: Normal)   Pulse (!) 105   Temp 98.4 F (36.9 C) (Oral)   Ht 5\' 10"  (1.778 m)   Wt 195 lb 4 oz (88.6 kg)   SpO2 99%   BMI 28.02 kg/m  General: Awake, appears stated age HEENT: MMM, EOMi Neuro: Gait is normal, no cerebellar signs, DTRs equal and symmetric throughout, 5/5 strength throughout, fluent speech MSK: No atrophy, no tenderness over the cervical paraspinal musculature, suboccipital triangle, TMJ, or temporal region Lungs: No accessory muscle use Psych: Age appropriate judgment and insight, normal affect and mood  Assessment and Plan: Migraine with aura and without status migrainosus, not intractable - Plan: Rimegepant Sulfate (NURTEC) 75 MG TBDP, amitriptyline (ELAVIL) 25 MG tablet, ketorolac (TORADOL) injection 60 mg  Status: Chronic, uncontrolled.  Toradol injection today.  Start amitriptyline 25 mg nightly for prophylaxis.  Nurtec 75 mg daily for abortive therapy.  Would send in frovatriptan if this is not covered.  I would like to see him in 1 month.  He'll let know if anything changes along the way. The patient voiced understanding and agreement to the plan.  [place], DO 08/22/20  3:12 PM

## 2020-08-22 NOTE — Patient Instructions (Signed)
Let me know if there are cost issues  Let me know if you have any issues with your medicine.  Continue magnesium 400 mg daily.  Let us know if you need anything.

## 2020-08-25 ENCOUNTER — Other Ambulatory Visit: Payer: Self-pay | Admitting: Family Medicine

## 2020-08-25 DIAGNOSIS — G43109 Migraine with aura, not intractable, without status migrainosus: Secondary | ICD-10-CM

## 2020-08-25 MED ORDER — FROVATRIPTAN SUCCINATE 2.5 MG PO TABS: 2.5000 mg | ORAL_TABLET | ORAL | 2 refills | Status: DC | PRN

## 2020-08-25 NOTE — Addendum Note (Signed)
Addended by: Radene Gunning on: 08/25/2020 04:27 PM   Modules accepted: Orders

## 2020-08-26 ENCOUNTER — Other Ambulatory Visit: Payer: Self-pay | Admitting: Family Medicine

## 2020-08-26 MED ORDER — ZOLMITRIPTAN 5 MG PO TABS: 5.0000 mg | ORAL_TABLET | ORAL | 0 refills | Status: DC | PRN

## 2020-08-26 MED ORDER — ZOLMITRIPTAN 5 MG PO TBDP: 5.0000 mg | ORAL_TABLET | ORAL | 2 refills | Status: DC | PRN

## 2020-08-28 MED FILL — ZOLMitriptan 5 MG TABS: 5 | 4 days supply | Qty: 4 | Fill #0

## 2020-09-13 ENCOUNTER — Other Ambulatory Visit: Payer: Self-pay | Admitting: Family Medicine

## 2020-09-13 DIAGNOSIS — G43109 Migraine with aura, not intractable, without status migrainosus: Secondary | ICD-10-CM

## 2020-09-26 ENCOUNTER — Ambulatory Visit: Payer: 59 | Admitting: Family Medicine

## 2020-09-29 ENCOUNTER — Other Ambulatory Visit: Payer: Self-pay

## 2020-09-29 ENCOUNTER — Ambulatory Visit: Payer: 59 | Admitting: Family Medicine

## 2020-09-29 ENCOUNTER — Encounter: Payer: Self-pay | Admitting: Family Medicine

## 2020-09-29 VITALS — BP 132/68 | HR 112 | Temp 98.3°F | Ht 70.0 in | Wt 206.2 lb

## 2020-09-29 DIAGNOSIS — G43109 Migraine with aura, not intractable, without status migrainosus: Secondary | ICD-10-CM | POA: Diagnosis not present

## 2020-09-29 DIAGNOSIS — K121 Other forms of stomatitis: Secondary | ICD-10-CM

## 2020-09-29 MED ORDER — BUTALBITAL-APAP-CAFFEINE 50-325-40 MG PO TABS: 1.0000 | ORAL_TABLET | Freq: Four times a day (QID) | ORAL | 0 refills | Status: DC | PRN

## 2020-09-29 MED ORDER — LIDOCAINE VISCOUS HCL 2 % MT SOLN: 15.0000 mL | OROMUCOSAL | 0 refills | Status: DC | PRN

## 2020-09-29 NOTE — Patient Instructions (Addendum)
Stay on the Elavil.   I will try to reach out to the Nurtec rep.   Let me know when you need a refill of the oral numbing medication.  Let us know if you need anything.

## 2020-09-29 NOTE — Progress Notes (Signed)
Chief Complaint  Patient presents with  . Migraine    Subjective: Patient is a 20 y.o. male here for f/u migraines.  Stasrted on Elavil 25 mg qhs. Did quite well with this w decreased freq and severity of headaches. Zomig didn't help, Nurtec wasn't covered. Currently has a mild headache. Also gets oral ulcers when he gets headaches. Has them now. His dentist sent in Magic Mouthwash. He gets them several times yearly.   Past Medical History:  Diagnosis Date  . Asthma   . Migraine     Objective: BP 132/68 (BP Location: Left Arm, Patient Position: Sitting, Cuff Size: Normal)   Pulse (!) 112   Temp 98.3 F (36.8 C) (Oral)   Ht 5\' 10"  (1.778 m)   Wt 206 lb 4 oz (93.6 kg)   SpO2 98%   BMI 29.59 kg/m  General: Awake, appears stated age. Mouth: +ulcerations w red rim on L buccal mucosa and lip Neuro: DTR's equal and symm throughout. No clonus, no cerebellar signs Msk: No ttp in subocc triangle or cerv parasp msc b/l Lungs: No accessory muscle use Psych: Age appropriate judgment and insight, normal affect and mood  Assessment and Plan: Migraine with aura and without status migrainosus, not intractable - Plan: butalbital-acetaminophen-caffeine (FIORICET) 50-325-40 MG tablet  Oral ulcer - Plan: lidocaine (XYLOCAINE) 2 % solution  1. Cont elavil 25 mg/d. Add Fioricet for abortive therapy. Will reach out to Nurtec rep to see about cost coverage. If this doesn't work, I will see him in 4-6 weeks. Otherwise I will see him in 6 mo for CPE. 2. Lido soln to rinse and spit prn.  The patient voiced understanding and agreement to the plan.  [place], DO 09/29/20  2:03 PM

## 2020-11-09 ENCOUNTER — Other Ambulatory Visit: Payer: Self-pay | Admitting: Family Medicine

## 2020-11-09 DIAGNOSIS — G43109 Migraine with aura, not intractable, without status migrainosus: Secondary | ICD-10-CM

## 2020-12-20 IMAGING — DX DG ANKLE COMPLETE 3+V*R*
3 series · 3 of 3 positions shown · non-contrast
Comparison: None.

CLINICAL DATA: 18-year-old male with right ankle pain.

EXAM:
RIGHT ANKLE - COMPLETE 3+ VIEW

[ankle ap]
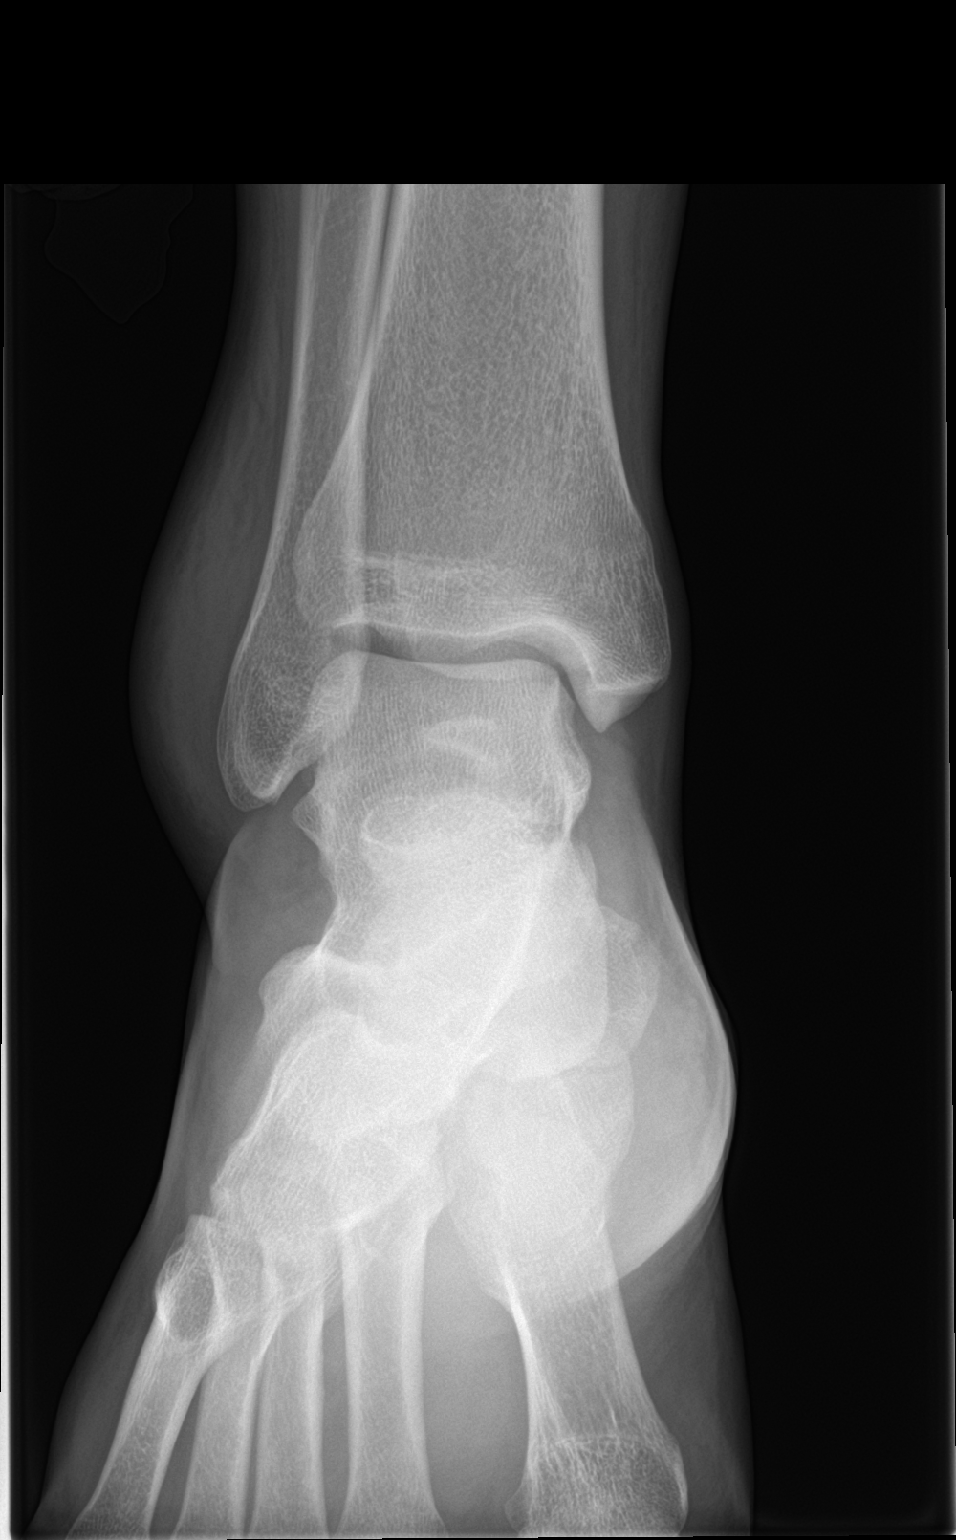

[ankle obl]
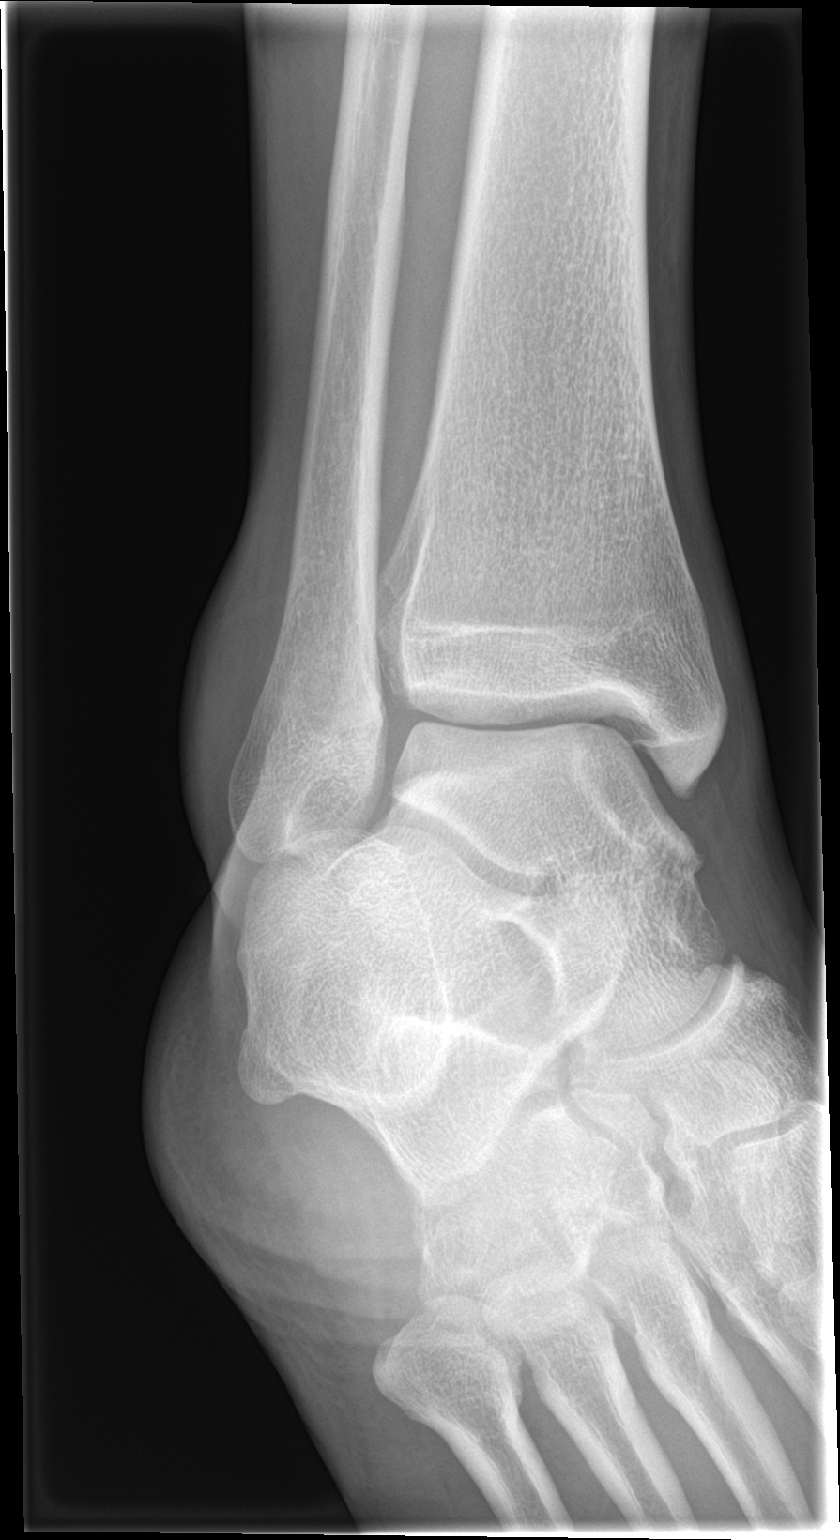

[ankle lat]
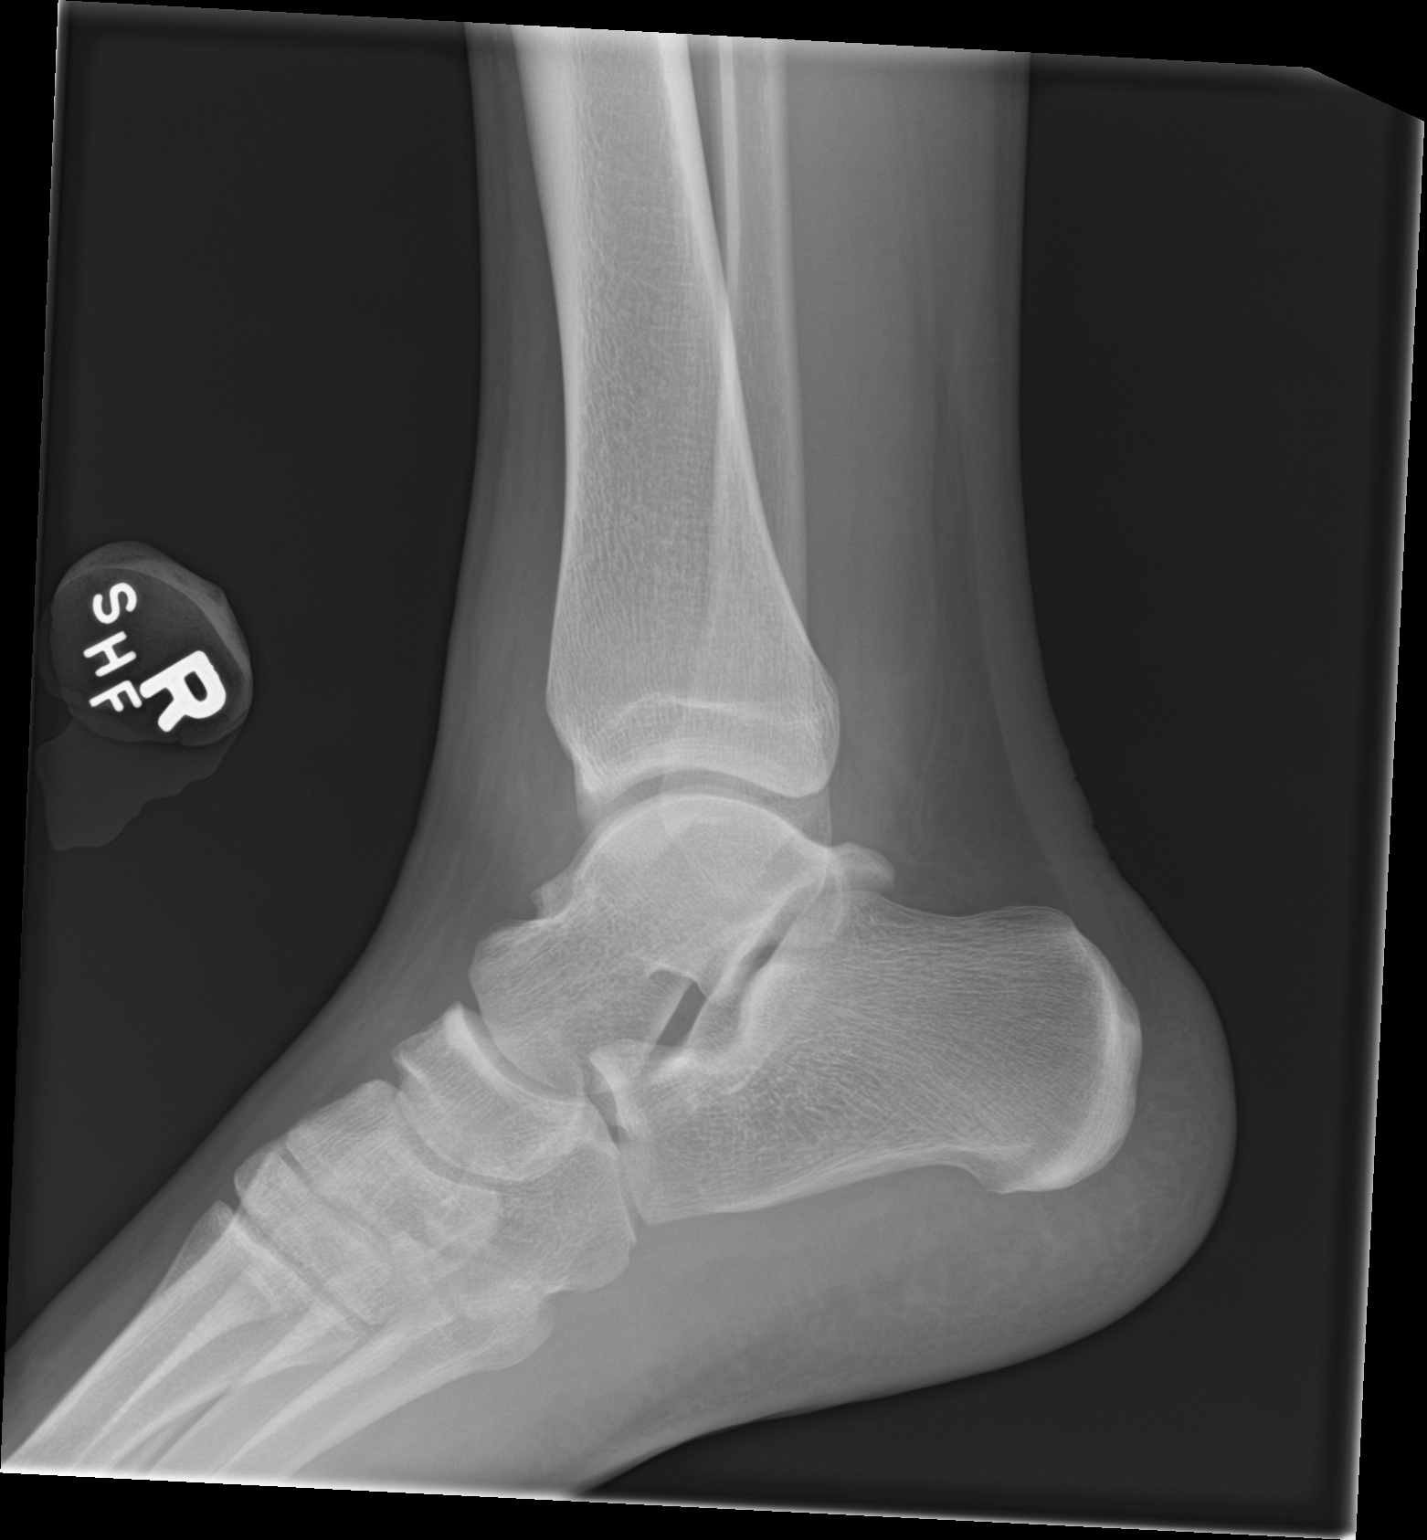

[3 of 3 positions shown; findings below may reference images not displayed]

FINDINGS: There is no acute fracture or dislocation. The bones are well
mineralized. No arthritic change. The ankle mortise is intact. There
is soft tissue swelling over the lateral malleolus. No radiopaque
foreign object or soft tissue gas.
IMPRESSION: No acute osseous pathology.

## 2021-02-18 ENCOUNTER — Other Ambulatory Visit: Payer: Self-pay | Admitting: Family Medicine

## 2021-02-18 DIAGNOSIS — G43109 Migraine with aura, not intractable, without status migrainosus: Secondary | ICD-10-CM

## 2021-02-19 MED ORDER — AMITRIPTYLINE HCL 25 MG PO TABS: ORAL_TABLET | ORAL | 1 refills | Status: DC

## 2021-02-19 NOTE — Telephone Encounter (Signed)
Requesting: Fioricet 50-325-40mg  Contract: N/A UDS: N/A Last Visit: 09/29/2020 Next Visit: None Last Refill: 09/29/2020 #20 and 0RF  Please Advise

## 2021-05-26 ENCOUNTER — Ambulatory Visit: Payer: 59 | Admitting: Family Medicine

## 2021-06-04 ENCOUNTER — Other Ambulatory Visit: Payer: Self-pay

## 2021-06-04 ENCOUNTER — Ambulatory Visit: Payer: 59 | Admitting: Family Medicine

## 2021-06-04 ENCOUNTER — Encounter: Payer: Self-pay | Admitting: Family Medicine

## 2021-06-04 VITALS — BP 130/70 | HR 102 | Temp 98.1°F | Resp 16 | Ht 70.0 in | Wt 216.2 lb

## 2021-06-04 DIAGNOSIS — J4530 Mild persistent asthma, uncomplicated: Secondary | ICD-10-CM | POA: Diagnosis not present

## 2021-06-04 DIAGNOSIS — K625 Hemorrhage of anus and rectum: Secondary | ICD-10-CM

## 2021-06-04 DIAGNOSIS — R1084 Generalized abdominal pain: Secondary | ICD-10-CM | POA: Diagnosis not present

## 2021-06-04 MED ORDER — MONTELUKAST SODIUM 10 MG PO TABS
10.0000 mg | ORAL_TABLET | Freq: Every day | ORAL | 3 refills | Status: DC
Start: 2021-06-04 — End: 2021-08-31

## 2021-06-04 NOTE — Patient Instructions (Signed)
If you do not hear anything about your referral in the next 1-2 weeks, call our office and ask for an update.  Give [place] 2-3 business days to get the results of your labs back.   Stay hydrated.  Let us know if you need anything.

## 2021-06-04 NOTE — Progress Notes (Signed)
Chief Complaint  Patient presents with   Rectal Bleeding    Almost 2 months ago, pt state having stomach pain, no diarrhea, no nausea or vomiting,  Pt states blood in the toilet and with wiping.     Subjective: Patient is a 20 y.o. male here for rectal bleeding.  2 mo has had blood in his stool. He has had stomach pain associated with this. Dad and uncle have Crohn's. He has a hx of random oral ulcers. He has not lost weight. No fevers, N/V, loose stools, injury, urinary complaints.   Patient with a history of asthma that is exacerbated by exercise and cold air.  It has been worse recently with the cold front.  He takes Xopenex but even that has not been particularly helpful as of late.  He is not on any maintenance inhaler or oral medication.  No current wheezing, coughing, or shortness of breath.  Past Medical History:  Diagnosis Date   Asthma    Migraine     Objective: BP 130/70 (BP Location: Left Arm, Patient Position: Sitting, Cuff Size: Normal)   Pulse (!) 102   Temp 98.1 F (36.7 C) (Oral)   Resp 16   Ht 5\' 10"  (1.778 m)   Wt 216 lb 3.2 oz (98.1 kg)   SpO2 97%   BMI 31.02 kg/m  General: Awake, appears stated age Heart: RRR Lungs: CTAB, no rales, wheezes or rhonchi. No accessory muscle use Abdomen: Bowel sounds present, soft, nontender, non-distended Rectum: No external bleeding, fissures or hemorrhoids noted, skin tag noted anteriorly Psych: Age appropriate judgment and insight, normal affect and mood  Assessment and Plan: Rectal bleeding - Plan: Ambulatory referral to Gastroenterology, CBC  Generalized abdominal pain - Plan: Ambulatory referral to Gastroenterology, Comprehensive metabolic panel  Mild persistent asthma without complication - Plan: montelukast (SINGULAIR) 10 MG tablet  1/2.  I am concerned about Crohn's disease given his family history and presentation.  We will refer to gastroenterology for further evaluation and possible scope.  We will check labs  as above but he is not having any signs of hemodynamic instability. 3.  Chronic, uncontrolled.  Add Singulair 10 mg daily.  Continue Xopenex as needed.  Consider adding oral antihistamine.  Follow-up in 1 month to recheck this.  Would consider adding inhaled corticosteroid at that time. The patient voiced understanding and agreement to the plan.  [place], DO 06/04/21  3:47 PM

## 2021-06-05 LAB — COMPREHENSIVE METABOLIC PANEL
ALT: 23 U/L (ref 0–53)
AST: 35 U/L (ref 0–37)
Albumin: 4.9 g/dL (ref 3.5–5.2)
Alkaline Phosphatase: 72 U/L (ref 39–117)
BUN: 15 mg/dL (ref 6–23)
CO2: 31 mEq/L (ref 19–32)
Calcium: 9.7 mg/dL (ref 8.4–10.5)
Chloride: 102 mEq/L (ref 96–112)
Creatinine, Ser: 1.12 mg/dL (ref 0.40–1.50)
GFR: 94.21 mL/min (ref >60.00)
Glucose, Bld: 93 mg/dL (ref 70–99)
Potassium: 4.2 mEq/L (ref 3.5–5.1)
Sodium: 140 mEq/L (ref 135–145)
Total Bilirubin: 0.5 mg/dL (ref 0.2–1.2)
Total Protein: 7.4 g/dL (ref 6.0–8.3)

## 2021-06-10 ENCOUNTER — Ambulatory Visit: Payer: 59 | Admitting: Family Medicine

## 2021-06-15 ENCOUNTER — Encounter: Payer: Self-pay | Admitting: Gastroenterology

## 2021-07-02 ENCOUNTER — Ambulatory Visit: Payer: 59 | Admitting: Gastroenterology

## 2021-07-03 ENCOUNTER — Ambulatory Visit: Payer: 59 | Admitting: Family Medicine

## 2021-07-27 ENCOUNTER — Other Ambulatory Visit: Payer: Self-pay

## 2021-07-28 ENCOUNTER — Ambulatory Visit: Payer: 59 | Admitting: Family Medicine

## 2021-07-28 ENCOUNTER — Encounter: Payer: Self-pay | Admitting: Family Medicine

## 2021-07-28 VITALS — BP 124/79 | HR 120 | Temp 101.6°F | Ht 70.0 in | Wt 221.0 lb

## 2021-07-28 DIAGNOSIS — G43909 Migraine, unspecified, not intractable, without status migrainosus: Secondary | ICD-10-CM

## 2021-07-28 LAB — POCT INFLUENZA A/B
Influenza A, POC: NEGATIVE
Influenza B, POC: NEGATIVE

## 2021-07-28 LAB — POC COVID19 BINAXNOW: SARS Coronavirus 2 Ag: POSITIVE — AB

## 2021-07-28 MED ORDER — ONDANSETRON 4 MG PO TBDP: 4.0000 mg | ORAL_TABLET | Freq: Three times a day (TID) | ORAL | 0 refills | Status: DC | PRN

## 2021-07-28 MED ORDER — KETOROLAC TROMETHAMINE 60 MG/2ML IM SOLN
60.0000 mg | Freq: Once | INTRAMUSCULAR | Status: AC
  Administered 2021-07-28: 60 mg via INTRAMUSCULAR

## 2021-07-28 NOTE — Patient Instructions (Signed)
Use the Zofran as needed.  No NSAIDs for the rest of the day.   OK to take Tylenol 1000 mg (2 extra strength tabs) or 975 mg (3 regular strength tabs) every 6 hours as needed.  Let us know if you need anything.

## 2021-07-28 NOTE — Progress Notes (Signed)
Chief Complaint  Patient presents with   Migraine     Devin Robinson is a 20 y.o. male here for evaluation of an acute headache.  Duration: 1 day Laterality: bilateral Quality: aching Associated symptoms: light/sound sensitivity Therapies tried: Fioricet, Advil Hx of migraine: Yes.  Past Medical History:  Diagnosis Date   Asthma    Migraine      Allergies as of 07/28/2021   No Known Allergies      Medication List        Accurate as of July 28, 2021 11:58 AM. If you have any questions, ask your nurse or doctor.          amitriptyline 25 MG tablet Commonly known as: ELAVIL TAKE 1 TABLET BY MOUTH EVERYDAY AT BEDTIME   butalbital-acetaminophen-caffeine 50-325-40 MG tablet Commonly known as: FIORICET TAKE 1-2 TABLETS BY MOUTH EVERY 6 (SIX) HOURS AS NEEDED FOR HEADACHE.   ergocalciferol 1.25 MG (50000 UT) capsule Commonly known as: VITAMIN D2 Take 1 capsule by mouth once a week.   levalbuterol 45 MCG/ACT inhaler Commonly known as: XOPENEX HFA Inhale 1-2 puffs into the lungs every 6 (six) hours as needed for wheezing or shortness of breath.   lidocaine 2 % solution Commonly known as: XYLOCAINE Use as directed 15 mLs in the mouth or throat as needed for mouth pain (Do not swallow.).   montelukast 10 MG tablet Commonly known as: SINGULAIR Take 1 tablet (10 mg total) by mouth at bedtime.   ondansetron 4 MG disintegrating tablet Commonly known as: ZOFRAN-ODT Take 1 tablet (4 mg total) by mouth every 8 (eight) hours as needed for nausea or vomiting. Started by: Sharlene Dory, DO   rizatriptan 10 MG tablet Commonly known as: MAXALT Take 10 mg by mouth.        BP 124/79    Pulse (!) 120    Temp (!) 101.6 F (38.7 C) (Oral)    Ht 5\' 10"  (1.778 m)    Wt 221 lb (100.2 kg)    SpO2 97%    BMI 31.71 kg/m  Gen: awake, alert, appearing stated age Eyes: PERRLA, EOMi, no injection, MMM, no pharyngeal erythema or exudate Lungs: no accessory muscle  use Neuro: CN2-12 grossly intact, fluent and goal-oriented speech, DTR's equal and symmetric in UE's and LE's MSK: 5/5 strength throughout, + TTP over bilateral cervical paraspinal musculature and bilateral trapezius, no tenderness over the occipital triangle region Psych: Age appropriate judgment and insight, normal affect and mood  Acute migraine - Plan: ondansetron (ZOFRAN-ODT) 4 MG disintegrating tablet  Exacerbation of chronic issue.  Zofran as needed, Toradol IM 60 mg today.  No NSAIDs for the rest of the day.  Tylenol.  He has abortive medication at home.  Recommended taking at onset of aura next time.  We will also swab him for flu and COVID.  He notes he usually has a fever with migraines but will still test for these F/u prn. The pt voiced understanding and agreement to the plan.  [place], DO 07/28/21 11:58 AM

## 2021-07-28 NOTE — Addendum Note (Signed)
Addended by: Thelma Barge D on: 07/28/2021 12:16 PM   Modules accepted: Orders

## 2021-07-29 ENCOUNTER — Emergency Department (HOSPITAL_BASED_OUTPATIENT_CLINIC_OR_DEPARTMENT_OTHER)
Admission: EM | Admit: 2021-07-29 | Discharge: 2021-07-29 | Disposition: A | Discharge status: Home / Self Care | Payer: 59 | Attending: Emergency Medicine | Admitting: Emergency Medicine

## 2021-07-29 ENCOUNTER — Other Ambulatory Visit: Payer: Self-pay

## 2021-07-29 ENCOUNTER — Encounter (HOSPITAL_BASED_OUTPATIENT_CLINIC_OR_DEPARTMENT_OTHER): Payer: Self-pay

## 2021-07-29 DIAGNOSIS — R000 Tachycardia, unspecified: Secondary | ICD-10-CM | POA: Diagnosis not present

## 2021-07-29 DIAGNOSIS — Z8616 Personal history of COVID-19: Secondary | ICD-10-CM | POA: Diagnosis not present

## 2021-07-29 DIAGNOSIS — I10 Essential (primary) hypertension: Secondary | ICD-10-CM | POA: Insufficient documentation

## 2021-07-29 DIAGNOSIS — U071 COVID-19: Secondary | ICD-10-CM | POA: Insufficient documentation

## 2021-07-29 DIAGNOSIS — J45909 Unspecified asthma, uncomplicated: Secondary | ICD-10-CM | POA: Diagnosis not present

## 2021-07-29 MED ORDER — KETOROLAC TROMETHAMINE 30 MG/ML IJ SOLN
30.0000 mg | Freq: Once | INTRAMUSCULAR | Status: AC
  Administered 2021-07-29: 06:00:00 30 mg via INTRAVENOUS
  Filled 2021-07-29: qty 1

## 2021-07-29 MED ORDER — METOCLOPRAMIDE HCL 5 MG/ML IJ SOLN
10.0000 mg | Freq: Once | INTRAMUSCULAR | Status: AC
  Administered 2021-07-29: 06:00:00 10 mg via INTRAVENOUS
  Filled 2021-07-29: qty 2

## 2021-07-29 MED ORDER — DIPHENHYDRAMINE HCL 50 MG/ML IJ SOLN
25.0000 mg | Freq: Once | INTRAMUSCULAR | Status: AC
  Administered 2021-07-29: 06:00:00 25 mg via INTRAVENOUS
  Filled 2021-07-29: qty 1

## 2021-07-29 MED ORDER — LACTATED RINGERS IV BOLUS
2000.0000 mL | Freq: Once | INTRAVENOUS | Status: AC
  Administered 2021-07-29: 06:00:00 2000 mL via INTRAVENOUS

## 2021-07-29 MED ORDER — ACETAMINOPHEN 500 MG PO TABS
1000.0000 mg | ORAL_TABLET | Freq: Once | ORAL | Status: AC
  Administered 2021-07-29: 06:00:00 1000 mg via ORAL
  Filled 2021-07-29: qty 2

## 2021-07-29 NOTE — ED Triage Notes (Signed)
Patient complains of headache, cough, body aches, and sore throat.  States he tested positive for Covid 19 yesterday.

## 2021-07-29 NOTE — ED Notes (Signed)
AVS provided to and discussed with patient. Pt verbalizes understanding of discharge instructions and denies any questions or concerns at this time. Pt ambulated out of department independently with steady gait.  

## 2021-07-29 NOTE — Discharge Instructions (Signed)
Follow isolation precautions per CDC guidelines.  Come back to ER if you develop difficulty breathing, chest pain, vomiting or other new concerning symptom.

## 2021-07-29 NOTE — ED Provider Notes (Signed)
[facility] EMERGENCY DEPARTMENT Provider Note   CSN: [id number] Arrival date & time: 07/29/21  0524     History Chief Complaint  Patient presents with   Covid Positive    Arval Mcguiness is a 20 y.o. male.  HPI 20 year old male with history of asthma, hypertension, and migraines, presenting for symptoms of COVID-19.  He was seen by his primary care doctor yesterday for migraine headache.  He was given Toradol and prescribed as needed Zofran.  At that time, he did undergo COVID testing.  Patient tested positive for COVID.  He presents today for worsening symptoms.  He states that after he went home from his PCPs office, he had persistent headache.  He developed diffuse myalgias, nausea, sore throat, and fatigue.  He does have a history of asthma but endorses only mild chest tightness.  Typically, he will use his albuterol inhaler prior to exercise.  He denies any current shortness of breath.  He did take NyQuil last night.  He has not taken anything this morning.  He has had a previous COVID-19 infection 2 years ago.  He is also had vaccination and booster.    Past Medical History:  Diagnosis Date   Asthma    Migraine     Patient Active Problem List   Diagnosis Date Noted   Migraine with aura and without status migrainosus, not intractable 08/22/2020   Chronic diarrhea 07/27/2018   Generalized abdominal pain 07/27/2018   Vitamin D deficiency 11/21/2017   Obesity due to excess calories with serious comorbidity and body mass index (BMI) in 95th to 98th percentile for age in pediatric patient 11/16/2017   Essential hypertension 07/25/2017    Past Surgical History:  Procedure Laterality Date   TONSILLECTOMY         History reviewed. No pertinent family history.  Social History   Tobacco Use   Smoking status: Never    Passive exposure: Never   Smokeless tobacco: Never  Vaping Use   Vaping Use: Never used  Substance Use Topics   Alcohol use: No   Drug use:  Never    Home Medications Prior to Admission medications   Medication Sig Start Date End Date Taking? Authorizing Provider  amitriptyline (ELAVIL) 25 MG tablet TAKE 1 TABLET BY MOUTH EVERYDAY AT BEDTIME 02/19/21   Wendling, Crosby Oyster, DO  butalbital-acetaminophen-caffeine (FIORICET) [phone] MG tablet TAKE 1-2 TABLETS BY MOUTH EVERY 6 (SIX) HOURS AS NEEDED FOR HEADACHE. 02/19/21 02/19/22  Shelda Pal, DO  ergocalciferol (VITAMIN D2) 1.25 MG (50000 UT) capsule Take 1 capsule by mouth once a week. 08/11/18   [provider]  levalbuterol Penne Lash HFA) 45 MCG/ACT inhaler Inhale 1-2 puffs into the lungs every 6 (six) hours as needed for wheezing or shortness of breath. 11/06/19   Wendling, Crosby Oyster, DO  lidocaine (XYLOCAINE) 2 % solution Use as directed 15 mLs in the mouth or throat as needed for mouth pain (Do not swallow.). 09/29/20   Shelda Pal, DO  montelukast (SINGULAIR) 10 MG tablet Take 1 tablet (10 mg total) by mouth at bedtime. 06/04/21   Shelda Pal, DO  ondansetron (ZOFRAN-ODT) 4 MG disintegrating tablet Take 1 tablet (4 mg total) by mouth every 8 (eight) hours as needed for nausea or vomiting. 07/28/21   Shelda Pal, DO  rizatriptan (MAXALT) 10 MG tablet Take 10 mg by mouth.    [provider]  zolmitriptan (ZOMIG) 5 MG tablet Take 1 tablet (5 mg total) by mouth as  needed for migraine. Do not exceed 2 tabs in 24 hours. 08/26/20 09/29/20  Sharlene Dory, DO    Allergies    Patient has no known allergies.  Review of Systems   Review of Systems  Constitutional:  Positive for activity change, appetite change, fatigue and fever. Negative for chills.  HENT:  Positive for congestion and sore throat. Negative for ear pain, trouble swallowing and voice change.   Eyes:  Negative for pain and visual disturbance.  Respiratory:  Positive for cough, chest tightness and shortness of breath.   Cardiovascular:  Negative for  chest pain and palpitations.  Gastrointestinal:  Positive for nausea. Negative for abdominal pain, diarrhea and vomiting.  Genitourinary:  Negative for dysuria, flank pain and hematuria.  Musculoskeletal:  Positive for myalgias. Negative for arthralgias, back pain, joint swelling, neck pain and neck stiffness.  Skin:  Negative for color change and rash.  Neurological:  Positive for headaches. Negative for dizziness, seizures, syncope, weakness, light-headedness and numbness.  Hematological:  Does not bruise/bleed easily.  Psychiatric/Behavioral:  Negative for confusion and decreased concentration.   All other systems reviewed and are negative.  Physical Exam Updated Vital Signs BP 138/75    Pulse (!) 112    Temp 98.3 F (36.8 C) (Oral)    Resp 18    Ht 5\' 10"  (1.778 m)    Wt 100.2 kg    SpO2 99%    BMI 31.70 kg/m   Physical Exam Vitals and nursing note reviewed.  Constitutional:      General: He is not in acute distress.    Appearance: Normal appearance. He is well-developed and normal weight. He is not ill-appearing, toxic-appearing or diaphoretic.  HENT:     Head: Normocephalic and atraumatic.     Right Ear: Tympanic membrane, ear canal and external ear normal.     Left Ear: Tympanic membrane, ear canal and external ear normal.     Nose: Congestion present.     Mouth/Throat:     Mouth: Mucous membranes are moist.     Pharynx: Oropharynx is clear. No oropharyngeal exudate or posterior oropharyngeal erythema.  Eyes:     Conjunctiva/sclera: Conjunctivae normal.  Cardiovascular:     Rate and Rhythm: Regular rhythm. Tachycardia present.     Heart sounds: No murmur heard. Pulmonary:     Effort: Pulmonary effort is normal. No respiratory distress.     Breath sounds: Normal breath sounds. No wheezing or rales.  Chest:     Chest wall: No tenderness.  Abdominal:     General: Abdomen is flat.     Palpations: Abdomen is soft.     Tenderness: There is no abdominal tenderness.   Musculoskeletal:        General: No swelling.     Cervical back: Normal range of motion and neck supple. No rigidity.     Right lower leg: No edema.     Left lower leg: No edema.  Skin:    General: Skin is warm and dry.     Capillary Refill: Capillary refill takes less than 2 seconds.     Coloration: Skin is not jaundiced or pale.  Neurological:     General: No focal deficit present.     Mental Status: He is alert and oriented to person, place, and time.     Cranial Nerves: No cranial nerve deficit.     Sensory: No sensory deficit.     Motor: No weakness.     Coordination: Coordination normal.  Psychiatric:        Mood and Affect: Mood normal.        Behavior: Behavior normal.        Thought Content: Thought content normal.        Judgment: Judgment normal.    ED Results / Procedures / Treatments   Labs (all labs ordered are listed, but only abnormal results are displayed) Labs Reviewed - No data to display  EKG None  Radiology No results found.  Procedures Procedures   Medications Ordered in ED Medications  acetaminophen (TYLENOL) tablet 1,000 mg (1,000 mg Oral Given 07/29/21 0608)  ketorolac (TORADOL) 30 MG/ML injection 30 mg (30 mg Intravenous Given 07/29/21 0612)  lactated ringers bolus 2,000 mL (2,000 mLs Intravenous New Bag/Given 07/29/21 0616)  metoCLOPramide (REGLAN) injection 10 mg (10 mg Intravenous Given 07/29/21 0613)  diphenhydrAMINE (BENADRYL) injection 25 mg (25 mg Intravenous Given 07/29/21 [id number])    ED Course  I have reviewed the triage vital signs and the nursing notes.  Pertinent labs & imaging results that were available during my care of the patient were reviewed by me and considered in my medical decision making (see chart for details).    MDM Rules/Calculators/A&P                          Patient presents for flulike symptoms secondary to COVID-19 infection.  Onset of symptoms was the night before last.  Patient got a Toradol shot  yesterday and took some NyQuil last night.  He has not taken anything today.  He has had worsening symptoms of myalgias, nausea, fever, and headache.  He is febrile tachycardic upon arrival.  He is well-appearing on exam.  Lungs are clear to auscultation.  He has no increased work of breathing.  Will provide medications for symptomatic relief: IV fluids, headache cocktail, Tylenol.  On reassessment, patient had improved symptoms but still endorsed headache and throat pain.  IV fluids are continuing to infuse.  Patient was encouraged to drink fluids as p.o. challenge.  Care of patient was signed out to oncoming ED provider.  Final Clinical Impression(s) / ED Diagnoses Final diagnoses:  [id number]    Rx / DC Orders ED Discharge Orders     None        Godfrey Pick, MD 07/29/21 [id number]

## 2021-07-29 NOTE — ED Notes (Signed)
Patient is afebrile at this time, but states his head is still "pounding."

## 2021-07-30 ENCOUNTER — Ambulatory Visit: Payer: 59 | Admitting: Gastroenterology

## 2021-08-29 ENCOUNTER — Other Ambulatory Visit: Payer: Self-pay | Admitting: Family Medicine

## 2021-08-29 DIAGNOSIS — G43109 Migraine with aura, not intractable, without status migrainosus: Secondary | ICD-10-CM

## 2021-08-31 ENCOUNTER — Other Ambulatory Visit: Payer: Self-pay | Admitting: Family Medicine

## 2021-08-31 DIAGNOSIS — J4530 Mild persistent asthma, uncomplicated: Secondary | ICD-10-CM

## 2021-09-04 ENCOUNTER — Encounter: Payer: Self-pay | Admitting: Gastroenterology

## 2021-09-04 ENCOUNTER — Other Ambulatory Visit (INDEPENDENT_AMBULATORY_CARE_PROVIDER_SITE_OTHER): Payer: 59

## 2021-09-04 ENCOUNTER — Ambulatory Visit: Payer: 59 | Admitting: Gastroenterology

## 2021-09-04 VITALS — BP 120/60 | HR 88 | Ht 70.0 in | Wt 216.6 lb

## 2021-09-04 DIAGNOSIS — R1084 Generalized abdominal pain: Secondary | ICD-10-CM | POA: Diagnosis not present

## 2021-09-04 DIAGNOSIS — R194 Change in bowel habit: Secondary | ICD-10-CM

## 2021-09-04 DIAGNOSIS — K625 Hemorrhage of anus and rectum: Secondary | ICD-10-CM | POA: Diagnosis not present

## 2021-09-04 LAB — SEDIMENTATION RATE: Sed Rate: 12 mm/hr (ref 0–15)

## 2021-09-04 LAB — TSH: TSH: 1.45 u[IU]/mL (ref 0.35–5.50)

## 2021-09-04 LAB — C-REACTIVE PROTEIN: CRP: <1 mg/dL (ref 0.5–20.0)

## 2021-09-04 MED ORDER — NA SULFATE-K SULFATE-MG SULF 17.5-3.13-1.6 GM/177ML PO SOLN: 1.0000 | Freq: Once | ORAL | 0 refills | Status: AC

## 2021-09-04 NOTE — Patient Instructions (Signed)
If you are age 22 or younger, your body mass index should be between 19-25. Your Body mass index is 31.08 kg/m. If this is out of the aformentioned range listed, please consider follow up with your Primary Care Provider.  ________________________________________________________  The [facility] providers would like to encourage you to use [facility] to communicate with providers for non-urgent requests or questions.  Due to long hold times on the telephone, sending your provider a message by [facility] may be a faster and more efficient way to get a response.  Please allow 48 business hours for a response.  Please remember that this is for non-urgent requests.  _______________________________________________________  Your provider has requested that you go to the basement level for lab work before leaving today. Press "B" on the elevator. The lab is located at the first door on the left as you exit the elevator.  You have been scheduled for a colonoscopy. Please follow written instructions given to you at your visit today.  Please pick up your prep supplies at the pharmacy within the next 1-3 days. If you use inhalers (even only as needed), please bring them with you on the day of your procedure.  Follow up pending the results of your Colonoscopy and lab.  Thank you for entrusting me with your care and choosing [facility].  Doug Sou, PA-C

## 2021-09-07 LAB — IGA: Immunoglobulin A: 85 mg/dL (ref 47–310)

## 2021-09-07 LAB — TISSUE TRANSGLUTAMINASE, IGA: (tTG) Ab, IgA: <1 U/mL

## 2021-09-08 ENCOUNTER — Encounter: Payer: Self-pay | Admitting: Gastroenterology

## 2021-09-08 DIAGNOSIS — K625 Hemorrhage of anus and rectum: Secondary | ICD-10-CM | POA: Insufficient documentation

## 2021-09-08 DIAGNOSIS — R194 Change in bowel habit: Secondary | ICD-10-CM | POA: Insufficient documentation

## 2021-09-08 NOTE — Progress Notes (Signed)
09/08/2021 Mohid Furuya [id number] 2000/09/11   HISTORY OF PRESENT ILLNESS: This is a 21 year old male who is referred to our office by Dr. Nani Ravens for evaluation of rectal bleeding and generalized abdominal pain.  He tells me that he has had stomach issues on and off for a while.  Says that he has just figured out food that is okay to eat and that seems to help.  He says that spicy foods seem to affect him.  He says that he was having severe abdominal pain and constipation a little while back.  Now he is moving his bowels better.  Has diarrhea or loose stools often.  Sees bright red blood intermittently.  For a while it was more frequent, but has been less as of late.  His family history of Crohn's disease in his dad and a maternal uncle.  Wanted to get checked out to see if this could all just be IBS and to rule out IBD.  No evaluation or imaging has been performed other than a normal CBC and CMP back in October.  Past Medical History:  Diagnosis Date   Asthma    Migraine    Past Surgical History:  Procedure Laterality Date   TONSILLECTOMY      reports that he has never smoked. He has never been exposed to tobacco smoke. He has never used smokeless tobacco. He reports that he does not drink alcohol and does not use drugs. family history is not on file. No Known Allergies    Outpatient Encounter Medications as of 09/04/2021  Medication Sig   amitriptyline (ELAVIL) 25 MG tablet TAKE 1 TABLET BY MOUTH EVERYDAY AT BEDTIME   butalbital-acetaminophen-caffeine (FIORICET) 50-325-40 MG tablet TAKE 1-2 TABLETS BY MOUTH EVERY 6 (SIX) HOURS AS NEEDED FOR HEADACHE.   levalbuterol (XOPENEX HFA) 45 MCG/ACT inhaler Inhale 1-2 puffs into the lungs every 6 (six) hours as needed for wheezing or shortness of breath.   montelukast (SINGULAIR) 10 MG tablet TAKE 1 TABLET BY MOUTH EVERYDAY AT BEDTIME   [EXPIRED] Na Sulfate-K Sulfate-Mg Sulf 17.5-3.13-1.6 GM/177ML SOLN Take 1 kit by mouth once for 1  dose.   [DISCONTINUED] ergocalciferol (VITAMIN D2) 1.25 MG (50000 UT) capsule Take 1 capsule by mouth once a week. (Patient not taking: Reported on 09/04/2021)   [DISCONTINUED] lidocaine (XYLOCAINE) 2 % solution Use as directed 15 mLs in the mouth or throat as needed for mouth pain (Do not swallow.). (Patient not taking: Reported on 09/04/2021)   [DISCONTINUED] ondansetron (ZOFRAN-ODT) 4 MG disintegrating tablet Take 1 tablet (4 mg total) by mouth every 8 (eight) hours as needed for nausea or vomiting. (Patient not taking: Reported on 09/04/2021)   [DISCONTINUED] rizatriptan (MAXALT) 10 MG tablet Take 10 mg by mouth. (Patient not taking: Reported on 09/04/2021)   [DISCONTINUED] zolmitriptan (ZOMIG) 5 MG tablet Take 1 tablet (5 mg total) by mouth as needed for migraine. Do not exceed 2 tabs in 24 hours.   No facility-administered encounter medications on file as of 09/04/2021.     REVIEW OF SYSTEMS  : All other systems reviewed and negative except where noted in the History of Present Illness.   PHYSICAL EXAM: BP 120/60    Pulse 88    Ht _0  (1.778 m)    Wt 216 lb 9.6 oz (98.2 kg)    BMI 31.08 kg/m  General: Well developed male in no acute distress Head: Normocephalic and atraumatic Eyes:  Sclerae anicteric, conjunctiva pink. Ears: Normal auditory acuity Lungs: Clear  throughout to auscultation; no W/R/R. Heart: Regular rate and rhythm; no M/R/G. Abdomen: Soft, non-distended.  BS present.  Non-tender. Rectal:  Will be done at the time of colonoscopy. Musculoskeletal: Symmetrical with no gross deformities  Skin: No lesions on visible extremities Extremities: No edema  Neurological: Alert oriented x 4, grossly non-focal Psychological:  Alert and cooperative. Normal mood and affect  ASSESSMENT AND PLAN: *21 year old male with complaints of intermittent diarrhea/loose stools, rectal bleeding, generalized abdominal pain.  Has family history of Crohn's disease in his dad and a maternal uncle.   Has had issues at least on and off for years.  Possibly IBS, but rule out other sources including IBD, celiac, etc.  We will check TSH, sed rate, CRP, celiac labs.  We will plan for colonoscopy with Dr. Ardis Hughs.  The risks, benefits, and alternatives to colonoscopy were discussed with the patient and he consents to proceed.  Pending results could consider trial of antispasmodic.   CC:  Devin Robinson*

## 2021-09-10 NOTE — Progress Notes (Signed)
I agree with the above not3e, plan

## 2021-09-10 NOTE — Addendum Note (Signed)
Addended by: Alonza Bogus D on: 09/10/2021 03:30 PM   Modules accepted: Level of Service

## 2021-09-21 ENCOUNTER — Encounter: Payer: Self-pay | Admitting: Gastroenterology

## 2021-09-25 ENCOUNTER — Encounter: Payer: Self-pay | Admitting: Gastroenterology

## 2021-09-25 ENCOUNTER — Other Ambulatory Visit: Payer: Self-pay

## 2021-09-25 ENCOUNTER — Ambulatory Visit (AMBULATORY_SURGERY_CENTER): Payer: 59 | Admitting: Gastroenterology

## 2021-09-25 VITALS — BP 131/72 | HR 71 | Temp 97.5°F | Resp 12 | Ht 70.0 in | Wt 216.0 lb

## 2021-09-25 DIAGNOSIS — R1084 Generalized abdominal pain: Secondary | ICD-10-CM

## 2021-09-25 DIAGNOSIS — K648 Other hemorrhoids: Secondary | ICD-10-CM | POA: Diagnosis not present

## 2021-09-25 DIAGNOSIS — K6289 Other specified diseases of anus and rectum: Secondary | ICD-10-CM

## 2021-09-25 DIAGNOSIS — R194 Change in bowel habit: Secondary | ICD-10-CM

## 2021-09-25 DIAGNOSIS — K625 Hemorrhage of anus and rectum: Secondary | ICD-10-CM | POA: Diagnosis not present

## 2021-09-25 MED ORDER — SODIUM CHLORIDE 0.9 % IV SOLN: 500.0000 mL | Freq: Once | INTRAVENOUS | Status: DC

## 2021-09-25 NOTE — Progress Notes (Signed)
VS by CW. ?

## 2021-09-25 NOTE — Patient Instructions (Signed)
Await pathology results from the biopsies taken today. ? ?Resume previous diet and medications. ? ? ?YOU HAD AN ENDOSCOPIC PROCEDURE TODAY AT THE [facility]:   Refer to the procedure report that was given to you for any specific questions about what was found during the examination.  If the procedure report does not answer your questions, please call your gastroenterologist to clarify.  If you requested that your care partner not be given the details of your procedure findings, then the procedure report has been included in a sealed envelope for you to review at your convenience later. ? ?YOU SHOULD EXPECT: Some feelings of bloating in the abdomen. Passage of more gas than usual.  Walking can help get rid of the air that was put into your GI tract during the procedure and reduce the bloating. If you had a lower endoscopy (such as a colonoscopy or flexible sigmoidoscopy) you may notice spotting of blood in your stool or on the toilet paper. If you underwent a bowel prep for your procedure, you may not have a normal bowel movement for a few days. ? ?Please Note:  You might notice some irritation and congestion in your nose or some drainage.  This is from the oxygen used during your procedure.  There is no need for concern and it should clear up in a day or so. ? ?SYMPTOMS TO REPORT IMMEDIATELY: ? ?Following lower endoscopy (colonoscopy or flexible sigmoidoscopy): ? Excessive amounts of blood in the stool ? Significant tenderness or worsening of abdominal pains ? Swelling of the abdomen that is new, acute ? Fever of 100?F or higher ? ?For urgent or emergent issues, a gastroenterologist can be reached at any hour by calling [phone]. ?Do not use MyChart messaging for urgent concerns.  ? ? ?DIET:  We do recommend a small meal at first, but then you may proceed to your regular diet.  Drink plenty of fluids but you should avoid alcoholic beverages for 24 hours. ? ?ACTIVITY:  You should plan to take it  easy for the rest of today and you should NOT DRIVE or use heavy machinery until tomorrow (because of the sedation medicines used during the test).   ? ?FOLLOW UP: ?Our staff will call the number listed on your records 48-72 hours following your procedure to check on you and address any questions or concerns that you may have regarding the information given to you following your procedure. If we do not reach you, we will leave a message.  We will attempt to reach you two times.  During this call, we will ask if you have developed any symptoms of COVID 19. If you develop any symptoms (ie: fever, flu-like symptoms, shortness of breath, cough etc.) before then, please call [phone].  If you test positive for Covid 19 in the 2 weeks post procedure, please call and report this information to us.   ? ?If any biopsies were taken you will be contacted by phone or by letter within the next 1-3 weeks.  Please call us at [phone] if you have not heard about the biopsies in 3 weeks.  ? ? ?SIGNATURES/CONFIDENTIALITY: ?You and/or your care partner have signed paperwork which will be entered into your electronic medical record.  These signatures attest to the fact that that the information above on your After Visit Summary has been reviewed and is understood.  Full responsibility of the confidentiality of this discharge information lies with you and/or your care-partner.  ?

## 2021-09-25 NOTE — Progress Notes (Signed)
Called to room to assist during endoscopic procedure.  Patient ID and intended procedure confirmed with present staff. Received instructions for my participation in the procedure from the performing physician.  

## 2021-09-25 NOTE — Op Note (Signed)
[place] Patient Name: Devin Robinson Procedure Date: 09/25/2021 8:23 AM MRN: [id number] Endoscopist: Milus Banister , MD Age: 21 Referring MD:  Date of Birth: 2001-04-14 Gender: Male Account #: [id number] Procedure:                Colonoscopy Indications:              Intermittent minor rectal bleeding, change in                            bowels, FH of Crohn's disease (father) Medicines:                Monitored Anesthesia Care Procedure:                Pre-Anesthesia Assessment:                           - Prior to the procedure, a History and Physical                            was performed, and patient medications and                            allergies were reviewed. The patient's tolerance of                            previous anesthesia was also reviewed. The risks                            and benefits of the procedure and the sedation                            options and risks were discussed with the patient.                            All questions were answered, and informed consent                            was obtained. Prior Anticoagulants: The patient has                            taken no previous anticoagulant or antiplatelet                            agents. ASA Grade Assessment: II - A patient with                            mild systemic disease. After reviewing the risks                            and benefits, the patient was deemed in                            satisfactory condition to undergo the procedure.  After obtaining informed consent, the colonoscope                            was passed under direct vision. Throughout the                            procedure, the patient's blood pressure, pulse, and                            oxygen saturations were monitored continuously. The                            CF [id number] was introduced through the anus                            and advanced to the  the terminal ileum. The                            colonoscopy was performed without difficulty. The                            patient tolerated the procedure well. The quality                            of the bowel preparation was good. The terminal                            ileum, ileocecal valve, appendiceal orifice, and                            rectum were photographed. Scope In: 8:27:19 AM Scope Out: 8:41:18 AM Scope Withdrawal Time: 0 hours 11 minutes 49 seconds  Total Procedure Duration: 0 hours 13 minutes 59 seconds  Findings:                 The terminal ileum appeared normal.                           The colon (entire examined portion) appeared                            normal. Biopsies for histology were taken with a                            cold forceps from the entire colon for evaluation                            of microscopic colitis.                           The distal rectum was very slightly ertyhematous                            and I biopsied it to check for chronic inflammation.  Small external hemorrhoid                           The exam was otherwise without abnormality on                            direct and retroflexion views. Complications:            No immediate complications. Estimated blood loss:                            None. Estimated Blood Loss:     Estimated blood loss: none. Impression:               - The examined portion of the ileum was normal.                           - The entire examined colon is normal. Biopsied to                            check for microscopic colitis.                           - The distal rectum was very slightly ertyhematous                            and I biopsied it to check for chronic inflammation.                           - Small external hemorrhoid                           - The examination was otherwise normal on direct                            and retroflexion  views. Recommendation:           - Patient has a contact number available for                            emergencies. The signs and symptoms of potential                            delayed complications were discussed with the                            patient. Return to normal activities tomorrow.                            Written discharge instructions were provided to the                            patient.                           - Resume previous diet.                           -  Continue present medications.                           - Await pathology results. Milus Banister, MD 09/25/2021 8:47:24 AM This report has been signed electronically.

## 2021-09-25 NOTE — Progress Notes (Signed)
°  The recent H&P (dated 09/04/2021) was reviewed, the patient was examined and there is no change in the patients condition since that H&P was completed.   Devin Robinson  09/25/2021, 7:53 AM

## 2021-09-25 NOTE — Progress Notes (Signed)
A and O x3. Report to RN. Tolerated MAC anesthesia well. 

## 2021-09-29 ENCOUNTER — Telehealth: Payer: Self-pay

## 2021-09-29 ENCOUNTER — Telehealth: Payer: Self-pay | Admitting: *Deleted

## 2021-09-29 NOTE — Telephone Encounter (Signed)
Second attempt, left VM.  

## 2021-09-29 NOTE — Telephone Encounter (Signed)
°  Follow up Call-  Call back number 09/25/2021  Post procedure Call Back phone  # [phone]  Permission to leave phone message Yes  Some recent data might be hidden   First follow up call attempt, LVM

## 2021-09-30 ENCOUNTER — Encounter: Payer: Self-pay | Admitting: Gastroenterology

## 2021-10-07 ENCOUNTER — Encounter: Payer: Self-pay | Admitting: Family Medicine

## 2021-10-07 ENCOUNTER — Other Ambulatory Visit: Payer: Self-pay

## 2021-10-07 DIAGNOSIS — J45990 Exercise induced bronchospasm: Secondary | ICD-10-CM

## 2021-10-07 MED ORDER — LEVALBUTEROL TARTRATE 45 MCG/ACT IN AERO: 1.0000 | INHALATION_SPRAY | Freq: Four times a day (QID) | RESPIRATORY_TRACT | 12 refills | Status: DC | PRN

## 2021-12-31 ENCOUNTER — Encounter: Payer: Self-pay | Admitting: Family Medicine

## 2022-01-05 ENCOUNTER — Ambulatory Visit: Payer: 59 | Admitting: Family Medicine

## 2022-01-05 ENCOUNTER — Encounter: Payer: Self-pay | Admitting: Family Medicine

## 2022-01-05 VITALS — BP 130/78 | HR 86 | Temp 98.2°F | Ht 70.0 in | Wt 218.0 lb

## 2022-01-05 DIAGNOSIS — Z111 Encounter for screening for respiratory tuberculosis: Secondary | ICD-10-CM | POA: Diagnosis not present

## 2022-01-05 DIAGNOSIS — L0591 Pilonidal cyst without abscess: Secondary | ICD-10-CM | POA: Diagnosis not present

## 2022-01-05 NOTE — Progress Notes (Signed)
Chief Complaint  Patient presents with   Follow-up    TB test     Devin Robinson is a 21 y.o. male here for a skin complaint.  Duration: several years Location: Tailbone, upper buttock Pruritic? No Painful? Sometimes Drainage? Not currently, sometimes New soaps/lotions/topicals/detergents? No Other associated symptoms: hx of I&D's Therapies tried thus far: none  Needs Tb test for work. No travel to Tb endemic areas of hx of +test.   Past Medical History:  Diagnosis Date   Asthma    Migraine     BP 130/78   Pulse 86   Temp 98.2 F (36.8 C) (Oral)   Ht 5\' 10"  (1.778 m)   Wt 218 lb (98.9 kg)   SpO2 99%   BMI 31.28 kg/m  Gen: awake, alert, appearing stated age Lungs: No accessory muscle use Skin: epidermal cyst opening noted in prox gluteal cleft without drainage, erythema, TTP, fluctuance, excoriation Psych: Age appropriate judgment and insight  Pilonidal cyst - Plan: Ambulatory referral to General Surgery  Screening-pulmonary TB - Plan: QuantiFERON-TB Gold Plus  Refer gen surg, no indication for I&D, abx at this time.  Ck above.  F/u prn. The patient voiced understanding and agreement to the plan.  [place], DO 01/05/22 1:51 PM

## 2022-01-05 NOTE — Patient Instructions (Signed)
Give us 2-3 business days to get the results of your labs back.   If you do not hear anything about your referral in the next 1-2 weeks, call our office and ask for an update.  Let us know if you need anything. 

## 2022-01-08 LAB — QUANTIFERON-TB GOLD PLUS
Mitogen-NIL: >10 IU/mL
NIL: 0.03 IU/mL
QuantiFERON-TB Gold Plus: NEGATIVE
TB1-NIL: 0 IU/mL
TB2-NIL: 0.01 IU/mL

## 2022-01-12 ENCOUNTER — Other Ambulatory Visit: Payer: Self-pay | Admitting: Family Medicine

## 2022-01-12 ENCOUNTER — Encounter: Payer: Self-pay | Admitting: Family Medicine

## 2022-01-12 MED ORDER — MELOXICAM 15 MG PO TABS: 15.0000 mg | ORAL_TABLET | Freq: Every day | ORAL | 0 refills | Status: DC

## 2022-01-12 MED ORDER — TRAMADOL HCL 50 MG PO TABS: 50.0000 mg | ORAL_TABLET | Freq: Three times a day (TID) | ORAL | 0 refills | Status: AC | PRN

## 2022-01-13 ENCOUNTER — Other Ambulatory Visit: Payer: Self-pay

## 2022-01-13 ENCOUNTER — Encounter (HOSPITAL_BASED_OUTPATIENT_CLINIC_OR_DEPARTMENT_OTHER): Payer: Self-pay | Admitting: Urology

## 2022-01-13 DIAGNOSIS — L0501 Pilonidal cyst with abscess: Secondary | ICD-10-CM | POA: Diagnosis present

## 2022-01-13 DIAGNOSIS — J45909 Unspecified asthma, uncomplicated: Secondary | ICD-10-CM | POA: Diagnosis not present

## 2022-01-13 NOTE — ED Triage Notes (Signed)
Pt states abscess on tail bone that started approx 1 week ago  States h/o same 1 year ago  Denies drainage

## 2022-01-14 ENCOUNTER — Emergency Department (HOSPITAL_BASED_OUTPATIENT_CLINIC_OR_DEPARTMENT_OTHER)
Admission: EM | Admit: 2022-01-14 | Discharge: 2022-01-14 | Disposition: A | Discharge status: Home / Self Care | Payer: 59 | Attending: Emergency Medicine | Admitting: Emergency Medicine

## 2022-01-14 DIAGNOSIS — L0501 Pilonidal cyst with abscess: Secondary | ICD-10-CM

## 2022-01-14 MED ORDER — DOXYCYCLINE HYCLATE 100 MG PO CAPS: 100.0000 mg | ORAL_CAPSULE | Freq: Two times a day (BID) | ORAL | 0 refills | Status: AC

## 2022-01-14 MED ORDER — LIDOCAINE-EPINEPHRINE (PF) 2 %-1:200000 IJ SOLN
10.0000 mL | Freq: Once | INTRAMUSCULAR | Status: AC
  Administered 2022-01-14: 10 mL
  Filled 2022-01-14: qty 20

## 2022-01-14 NOTE — ED Notes (Signed)
Pt A&OX4 ambulatory at d/c with independent steady gait, NAD. Pt verbalized understanding of d/c instructions, prescriptions and follow up care. 

## 2022-01-14 NOTE — ED Provider Notes (Signed)
Emergency Department Provider Note   I have reviewed the triage vital signs and the nursing notes.   HISTORY  Chief Complaint Abscess   HPI Devin Robinson is a 21 y.o. male with past medical history reviewed below including recurrent pilonidal abscess presents to the emergency department with reaccumulation of fluid in the pilonidal abscess area, which has been drained before.  He states he began having pain a week ago and is gradually worsened.  He was evaluated at an outside facility but drainage was not recommended.  He attempted to schedule follow-up with surgery but they are booking 1 to 2 months out at this point.  No drainage to the area but has had worsening pain.  No fevers or chills.   Past Medical History:  Diagnosis Date   Asthma    Migraine     Review of Systems  Constitutional: No fever/chills Eyes: No visual changes. ENT: No sore throat. Cardiovascular: Denies chest pain. Respiratory: Denies shortness of breath. Gastrointestinal: No abdominal pain.  No nausea, no vomiting.  No diarrhea.  No constipation. Genitourinary: Negative for dysuria. Musculoskeletal: Negative for back pain. Skin: Negative for rash. Positive abscess.  Neurological: Negative for headaches, focal weakness or numbness.   ____________________________________________   PHYSICAL EXAM:  VITAL SIGNS: ED Triage Vitals  Enc Vitals Group     BP 01/13/22 2348 133/70     Pulse Rate 01/13/22 2348 (!) 111     Resp 01/13/22 2348 18     Temp 01/13/22 2348 98.8 F (37.1 C)     Temp Source 01/13/22 2348 Oral     SpO2 01/13/22 2348 99 %     Weight 01/13/22 2351 215 lb (97.5 kg)     Height 01/13/22 2351 5\' 10"  (1.778 m)   Constitutional: Alert and oriented. Well appearing and in no acute distress. Eyes: Conjunctivae are normal.  Head: Atraumatic. Nose: No congestion/rhinnorhea. Mouth/Throat: Mucous membranes are moist.   Neck: No stridor.   Cardiovascular: Normal rate, regular rhythm.  Good peripheral circulation. Grossly normal heart sounds.   Respiratory: Normal respiratory effort.  No retractions. Lungs CTAB. Gastrointestinal: Soft and nontender. No distention.  Patient area of induration along the superior gluteal cleft.  Mild erythema.  No extensive cellulitis or crepitus.  No active drainage.  Musculoskeletal: No gross deformities of extremities. Neurologic:  Normal speech and language. No gross focal neurologic deficits are appreciated.  Skin:  Skin is warm, dry and intact. No rash noted.   ____________________________________________   PROCEDURES  Procedure(s) performed:   Marland KitchenMarland KitchenIncision and Drainage  Date/Time: 01/14/2022 3:02 AM Performed by: Maia Plan, MD Authorized by: Maia Plan, MD   Consent:    Consent obtained:  Verbal   Consent given by:  Patient   Risks, benefits, and alternatives were discussed: yes     Risks discussed:  Bleeding, infection, damage to other organs, incomplete drainage and pain Universal protocol:    Patient identity confirmed:  Verbally with patient Location:    Type:  Abscess   Size:  4 cm   Location:  Anogenital   Anogenital location:  Pilonidal Pre-procedure details:    Skin preparation:  Povidone-iodine Sedation:    Sedation type:  None Anesthesia:    Anesthesia method:  Local infiltration   Local anesthetic:  Lidocaine 2% WITH epi Procedure type:    Complexity:  Complex Procedure details:    Ultrasound guidance: no     Needle aspiration: yes     Needle size:  18 G  Incision types:  Single straight   Incision depth:  Subcutaneous   Wound management:  Probed and deloculated   Drainage:  Purulent   Drainage amount:  Copious   Wound treatment:  Wound left open   Packing materials:  1/4 in iodoform gauze   Amount 1/4" iodoform:  15 cm Post-procedure details:    Procedure completion:  Tolerated well, no immediate complications  ____________________________________________   INITIAL IMPRESSION /  ASSESSMENT AND PLAN / ED COURSE  Pertinent labs & imaging results that were available during my care of the patient were reviewed by me and considered in my medical decision making (see chart for details).   This patient is Presenting for Evaluation of gluteal pain, which does require a range of treatment options, and is a complaint that involves a high risk of morbidity and mortality.  The Differential Diagnoses include pilonidal cyst/abscess, gluteal abscess, perirectal abscess, developing sepsis.  Critical Interventions-    Medications  lidocaine-EPINEPHrine (XYLOCAINE W/EPI) 2 %-1:200000 (PF) injection 10 mL (10 mLs Infiltration Given by Other 01/14/22 0039)    Reassessment after intervention: Symptoms/pain greatly reduced after drainage and packing.   Social Determinants of Health Risk patient is a non-smoker.   Medical Decision Making: Summary:  Patient presents emergency department pilonidal cyst/abscess which is recurrent.  Vital signs not consistent with developing sepsis.  Discussed risk/benefit of incision and drainage procedure.  Was able to identify pocket of fluid with needle and then incised/drained the area.  Packing was placed.  Patient will return in 2 days for wound reevaluation and consideration of packing removal/repacking. Will start on abx.   Reevaluation with update and discussion with patient.  Pain significantly reduced after the procedure.  Discussed continuing to follow with general surgery as if this is a pilonidal cyst that may need further excision.   Disposition: discharge  ____________________________________________  FINAL CLINICAL IMPRESSION(S) / ED DIAGNOSES  Final diagnoses:  Pilonidal abscess     NEW OUTPATIENT MEDICATIONS STARTED DURING THIS VISIT:  Discharge Medication List as of 01/14/2022  1:28 AM     START taking these medications   Details  doxycycline (VIBRAMYCIN) 100 MG capsule Take 1 capsule (100 mg total) by mouth 2 (two) times  daily for 7 days., Starting Thu 01/14/2022, Until Thu 01/21/2022, Normal        Note:  This document was prepared using Dragon voice recognition software and may include unintentional dictation errors.  Nanda Quinton, MD, [facility] Emergency Medicine    Dorleen Kissel, [facility], MD 01/14/22 [phone]

## 2022-01-14 NOTE — Discharge Instructions (Signed)
You were seen in the emergency department today with an abscess.  We were able to drain this at the bedside and I am starting you on antibiotics.  The packing material may come out on its own however if it is still there on day 3 I would like for you to return to have it removed and potentially repacked.  Develop any new or suddenly worsening symptoms in the meantime please return for reevaluation.

## 2022-02-08 ENCOUNTER — Other Ambulatory Visit: Payer: Self-pay | Admitting: Family Medicine

## 2022-03-19 ENCOUNTER — Other Ambulatory Visit: Payer: Self-pay | Admitting: Family Medicine

## 2022-03-19 DIAGNOSIS — J4530 Mild persistent asthma, uncomplicated: Secondary | ICD-10-CM

## 2022-04-20 ENCOUNTER — Encounter: Payer: Self-pay | Admitting: Family Medicine

## 2022-08-18 ENCOUNTER — Telehealth: Payer: 59 | Admitting: Family

## 2022-08-18 DIAGNOSIS — R6889 Other general symptoms and signs: Secondary | ICD-10-CM

## 2022-08-18 MED ORDER — OSELTAMIVIR PHOSPHATE 75 MG PO CAPS: 75.0000 mg | ORAL_CAPSULE | Freq: Two times a day (BID) | ORAL | 0 refills | Status: DC

## 2022-08-18 NOTE — Progress Notes (Signed)

## 2022-10-07 ENCOUNTER — Encounter (HOSPITAL_BASED_OUTPATIENT_CLINIC_OR_DEPARTMENT_OTHER): Payer: Self-pay

## 2022-10-07 ENCOUNTER — Emergency Department (HOSPITAL_BASED_OUTPATIENT_CLINIC_OR_DEPARTMENT_OTHER)
Admission: EM | Admit: 2022-10-07 | Discharge: 2022-10-07 | Disposition: A | Discharge status: Home / Self Care | Payer: 59 | Attending: Emergency Medicine | Admitting: Emergency Medicine

## 2022-10-07 ENCOUNTER — Other Ambulatory Visit: Payer: Self-pay

## 2022-10-07 DIAGNOSIS — G43909 Migraine, unspecified, not intractable, without status migrainosus: Secondary | ICD-10-CM | POA: Diagnosis not present

## 2022-10-07 DIAGNOSIS — Z1152 Encounter for screening for COVID-19: Secondary | ICD-10-CM | POA: Insufficient documentation

## 2022-10-07 DIAGNOSIS — R112 Nausea with vomiting, unspecified: Secondary | ICD-10-CM

## 2022-10-07 DIAGNOSIS — R519 Headache, unspecified: Secondary | ICD-10-CM | POA: Diagnosis present

## 2022-10-07 DIAGNOSIS — R1084 Generalized abdominal pain: Secondary | ICD-10-CM | POA: Diagnosis not present

## 2022-10-07 LAB — RESP PANEL BY RT-PCR (RSV, FLU A&B, COVID)  RVPGX2
Influenza A by PCR: NEGATIVE
Influenza B by PCR: NEGATIVE
Resp Syncytial Virus by PCR: NEGATIVE
SARS Coronavirus 2 by RT PCR: NEGATIVE

## 2022-10-07 LAB — COMPREHENSIVE METABOLIC PANEL
ALT: 21 U/L (ref 0–44)
AST: 23 U/L (ref 15–41)
Albumin: 4.6 g/dL (ref 3.5–5.0)
Alkaline Phosphatase: 73 U/L (ref 38–126)
Anion gap: 5 (ref 5–15)
BUN: 17 mg/dL (ref 6–20)
CO2: 28 mmol/L (ref 22–32)
Calcium: 9 mg/dL (ref 8.9–10.3)
Chloride: 102 mmol/L (ref 98–111)
Creatinine, Ser: 1.13 mg/dL (ref 0.61–1.24)
GFR, Estimated: >60 mL/min (ref >60)
Glucose, Bld: 116 mg/dL — ABNORMAL HIGH (ref 70–99)
Potassium: 4 mmol/L (ref 3.5–5.1)
Sodium: 135 mmol/L (ref 135–145)
Total Bilirubin: 0.9 mg/dL (ref 0.3–1.2)
Total Protein: 8.1 g/dL (ref 6.5–8.1)

## 2022-10-07 LAB — CBC
HCT: 35.2 % — ABNORMAL LOW (ref 39.0–52.0)
Hemoglobin: 12 g/dL — ABNORMAL LOW (ref 13.0–17.0)
MCH: 28.2 pg (ref 26.0–34.0)
MCHC: 34.1 g/dL (ref 30.0–36.0)
MCV: 82.6 fL (ref 80.0–100.0)
Platelets: 185 10*3/uL (ref 150–400)
RBC: 4.26 MIL/uL (ref 4.22–5.81)
RDW: 12.4 % (ref 11.5–15.5)
WBC: 7.3 10*3/uL (ref 4.0–10.5)
nRBC: 0 % (ref 0.0–0.2)

## 2022-10-07 LAB — LIPASE, BLOOD: Lipase: 29 U/L (ref 11–51)

## 2022-10-07 MED ORDER — DIPHENHYDRAMINE HCL 50 MG/ML IJ SOLN
25.0000 mg | Freq: Once | INTRAMUSCULAR | Status: AC
  Administered 2022-10-07: 25 mg via INTRAVENOUS
  Filled 2022-10-07: qty 1

## 2022-10-07 MED ORDER — KETOROLAC TROMETHAMINE 15 MG/ML IJ SOLN
15.0000 mg | Freq: Once | INTRAMUSCULAR | Status: AC
  Administered 2022-10-07: 15 mg via INTRAVENOUS
  Filled 2022-10-07: qty 1

## 2022-10-07 MED ORDER — METOCLOPRAMIDE HCL 10 MG PO TABS: 10.0000 mg | ORAL_TABLET | Freq: Four times a day (QID) | ORAL | 0 refills | Status: DC

## 2022-10-07 MED ORDER — LACTATED RINGERS IV BOLUS
1000.0000 mL | Freq: Once | INTRAVENOUS | Status: AC
  Administered 2022-10-07: 1000 mL via INTRAVENOUS

## 2022-10-07 MED ORDER — METOCLOPRAMIDE HCL 5 MG/ML IJ SOLN
10.0000 mg | INTRAMUSCULAR | Status: AC
  Administered 2022-10-07: 10 mg via INTRAVENOUS
  Filled 2022-10-07: qty 2

## 2022-10-07 NOTE — ED Notes (Signed)
Pt placed on 2L [place] for O2 sat of 87. Provider at beside and aware.

## 2022-10-07 NOTE — Discharge Instructions (Addendum)
You were seen for your headache in the emergency department.  You were given medicines which improved your symptoms.  At home, please take Tylenol and ibuprofen for your headache.  You may also take the Reglan we have prescribed you for your headache or any nausea or vomiting that you have.  You may take this with Benadryl for severe headaches but please note that this will make you drowsy.    Check your MyChart online for the results of any tests that had not resulted by the time you left the emergency department.   Follow-up with your primary doctor in 2-3 days regarding your visit.    Return immediately to the emergency department if you experience any of the following: Vision changes, numbness or weakness of your arms or legs, or any other concerning symptoms.    Thank you for visiting our Emergency Department. It was a pleasure taking care of you today.

## 2022-10-07 NOTE — ED Provider Notes (Signed)
[facility] Provider Note   CSN: [id number] Arrival date & time: 10/07/22  [id number]     History  Chief Complaint  Patient presents with   Migraine   Abdominal Pain    Devin Robinson is a 22 y.o. male.  22 year old male with a history of migraines who presents emergency department with headache.  2 days ago started experiencing bitemporal pounding sensation that was gradual in onset.  Does radiate towards the back of his head which she says is typical of his migraines.  Also has associated photo and phonophobia.  Has had nausea and vomiting which is sometimes present with his migraines.  Has developed some abdominal pain since the vomiting.  Says that he also has been having sore throat as well as some diarrhea.  Denies any cough.  Unsure of fevers.  Presents because his home medications have not been working for his headache.  Denies any focal neurologic deficits.       Home Medications Prior to Admission medications   Medication Sig Start Date End Date Taking? Authorizing Provider  metoCLOPramide (REGLAN) 10 MG tablet Take 1 tablet (10 mg total) by mouth every 6 (six) hours. 10/07/22  Yes Fransico Meadow, MD  amitriptyline (ELAVIL) 25 MG tablet TAKE 1 TABLET BY MOUTH EVERYDAY AT BEDTIME 08/31/21   Wendling, Crosby Oyster, DO  levalbuterol [facility] HFA) 45 MCG/ACT inhaler Inhale 1-2 puffs into the lungs every 6 (six) hours as needed for wheezing or shortness of breath. 10/07/21   Nani Ravens, Crosby Oyster, DO  meloxicam (MOBIC) 15 MG tablet TAKE 1 TABLET (15 MG TOTAL) BY MOUTH DAILY. 02/08/22   Shelda Pal, DO  montelukast (SINGULAIR) 10 MG tablet TAKE 1 TABLET BY MOUTH EVERYDAY AT BEDTIME 03/19/22   Wendling, Crosby Oyster, DO  oseltamivir (TAMIFLU) 75 MG capsule Take 1 capsule (75 mg total) by mouth 2 (two) times daily. 08/18/22   Sharion Balloon, FNP  zolmitriptan (ZOMIG) 5 MG tablet Take 1 tablet (5 mg total) by mouth as needed  for migraine. Do not exceed 2 tabs in 24 hours. 08/26/20 09/29/20  Shelda Pal, DO      Allergies    Patient has no known allergies.    Review of Systems   Review of Systems  Physical Exam Updated Vital Signs BP 132/78 (BP Location: Right Arm)   Pulse (!) 119   Temp 99.2 F (37.3 C) (Oral)   Resp 18   Ht 5' 10"$  (1.778 m)   Wt 97.5 kg   SpO2 98%   BMI 30.85 kg/m  Physical Exam Vitals and nursing note reviewed.  Constitutional:      General: He is not in acute distress.    Appearance: He is well-developed.     Comments: Uncomfortable appearing.  Sitting in room with lights off.  HENT:     Head: Normocephalic and atraumatic.     Right Ear: External ear normal.     Left Ear: External ear normal.     Nose: Nose normal.  Eyes:     Extraocular Movements: Extraocular movements intact.     Conjunctiva/sclera: Conjunctivae normal.     Pupils: Pupils are equal, round, and reactive to light.  Neck:     Comments: No meningismus Cardiovascular:     Rate and Rhythm: Normal rate and regular rhythm.     Heart sounds: Normal heart sounds.  Pulmonary:     Effort: Pulmonary effort is normal. No respiratory distress.  Breath sounds: Normal breath sounds.  Abdominal:     General: There is no distension.     Palpations: Abdomen is soft. There is no mass.     Tenderness: There is abdominal tenderness (Diffuse). There is no guarding.  Musculoskeletal:     Cervical back: Normal range of motion and neck supple.     Right lower leg: No edema.     Left lower leg: No edema.  Skin:    General: Skin is warm and dry.  Neurological:     General: No focal deficit present.     Mental Status: He is alert and oriented to person, place, and time. Mental status is at baseline.     Comments: MENTAL STATUS: AAOx3 CRANIAL NERVES: II: Pupils equal and reactive 3 mm BL, no RAPD, no VF deficits III, IV, VI: EOM intact, no gaze preference or deviation, no nystagmus. V: normal sensation to  light touch in V1, V2, and V3 segments bilaterally VII: no facial weakness or asymmetry, no nasolabial fold flattening VIII: normal hearing to speech and finger friction IX, X: normal palatal elevation, no uvular deviation XI: 5/5 head turn and 5/5 shoulder shrug bilaterally XII: midline tongue protrusion MOTOR: 5/5 strength in R shoulder flexion, elbow flexion and extension, and grip strength. 5/5 strength in L shoulder flexion, elbow flexion and extension, and grip strength.  5/5 strength in R hip and knee flexion, knee extension, ankle plantar and dorsiflexion. 5/5 strength in L hip and knee flexion, knee extension, ankle plantar and dorsiflexion. SENSORY: Normal sensation to light touch in all extremities COORD: Normal finger to nose and heel to shin, no tremor, no dysmetria STATION: normal stance, no truncal ataxia GAIT: Normal   Psychiatric:        Mood and Affect: Mood normal.        Behavior: Behavior normal.     ED Results / Procedures / Treatments   Labs (all labs ordered are listed, but only abnormal results are displayed) Labs Reviewed  CBC - Abnormal; Notable for the following components:      Result Value   Hemoglobin 12.0 (*)    HCT 35.2 (*)    All other components within normal limits  COMPREHENSIVE METABOLIC PANEL - Abnormal; Notable for the following components:   Glucose, Bld 116 (*)    All other components within normal limits  RESP PANEL BY RT-PCR (RSV, FLU A&B, COVID)  RVPGX2  LIPASE, BLOOD    EKG None  Radiology No results found.  Procedures Procedures   Medications Ordered in ED Medications  ketorolac (TORADOL) 15 MG/ML injection 15 mg (15 mg Intravenous Given 10/07/22 0810)  metoCLOPramide (REGLAN) injection 10 mg (10 mg Intravenous Given 10/07/22 0810)  diphenhydrAMINE (BENADRYL) injection 25 mg (25 mg Intravenous Given 10/07/22 0809)  lactated ringers bolus 1,000 mL (1,000 mLs Intravenous New Bag/Given 10/07/22 [id number])    ED Course/ Medical  Decision Making/ A&P                             Medical Decision Making Amount and/or Complexity of Data Reviewed Labs: ordered.  Risk Prescription drug management.   Devin Robinson is a 22 y.o. male with comorbidities that complicate the patient evaluation including with a history of migraines who presents emergency department with headache.  Initial Ddx:  Migraine, meningitis, URI, ICH  MDM:  Feel the patient likely has a migraine given his age and history of migraines and reassuring neuroexam.  May have upper respiratory tract infection or gastroenteritis with his GI symptoms and sore throat so we will obtain COVID and flu swab and give fluids.  No symptoms suggest meningitis and with nonfocal neurologic exam do not feel that CT is warranted to evaluate for other concerning diagnoses such as ICH.  Plan:  Labs COVID/flu IV fluids Toradol Reglan Benadryl  ED Summary/Re-evaluation:  Patient reassessed and is feeling much better after the above interventions.  Was able to tolerate p.o. without difficulty.  Was discharged home with instructions to take Tylenol, ibuprofen, and the Reglan as needed for his headache.  Also instructed to follow-up with his primary doctor in several days regarding his symptoms.  This patient presents to the ED for concern of complaints listed in HPI, this involves an extensive number of treatment options, and is a complaint that carries with it a high risk of complications and morbidity. Disposition including potential need for admission considered.   Dispo: DC Home. Return precautions discussed including, but not limited to, those listed in the AVS. Allowed pt time to ask questions which were answered fully prior to dc.  Records reviewed Outpatient Clinic Notes The following labs were independently interpreted: Chemistry and show no acute abnormality I personally reviewed and interpreted cardiac monitoring: normal sinus rhythm  and sinus  tachycardia I personally reviewed and interpreted the pt's EKG: see above for interpretation  I have reviewed the patients home medications and made adjustments as needed  Final Clinical Impression(s) / ED Diagnoses Final diagnoses:  Migraine without status migrainosus, not intractable, unspecified migraine type  Nausea and vomiting, unspecified vomiting type    Rx / DC Orders ED Discharge Orders          Ordered    metoCLOPramide (REGLAN) 10 MG tablet  Every 6 hours        10/07/22 1003              Fransico Meadow, MD 10/07/22 1007

## 2022-10-07 NOTE — ED Notes (Signed)
Pt provided with crackers and ginger ale for PO challenge per providers request.

## 2022-10-07 NOTE — ED Triage Notes (Signed)
C/o migraine x 2 days, nausea, vomiting. Unable to tolerate PO. Light/noise sensitivity. States meds not working. Also c/o LUQ abdominal pain.

## 2022-11-12 ENCOUNTER — Telehealth: Payer: 59 | Admitting: Family Medicine

## 2022-11-12 DIAGNOSIS — J40 Bronchitis, not specified as acute or chronic: Secondary | ICD-10-CM | POA: Diagnosis not present

## 2022-11-12 MED ORDER — PREDNISONE 20 MG PO TABS: 20.0000 mg | ORAL_TABLET | Freq: Two times a day (BID) | ORAL | 0 refills | Status: AC

## 2022-11-12 MED ORDER — BENZONATATE 200 MG PO CAPS: 200.0000 mg | ORAL_CAPSULE | Freq: Two times a day (BID) | ORAL | 0 refills | Status: DC | PRN

## 2022-11-12 NOTE — Progress Notes (Signed)
E-Visit for Cough  We are sorry that you are not feeling well.  Here is how we plan to help!  Based on your presentation I believe you most likely have A cough due to allergies.  I recommend that you start the an over-the counter-allergy medication such as Claritin 10 mg or Zyrtec 10 mg daily.     In addition you may use A prescription cough medication called Tessalon Perles 100mg . You may take 1-2 capsules every 8 hours as needed for your cough.  Prednisone 20 mg po bid for 5 days  From your responses in the eVisit questionnaire you describe inflammation in the upper respiratory tract which is causing a significant cough.  This is commonly called Bronchitis and has four common causes:   Allergies Viral Infections Acid Reflux Bacterial Infection Allergies, viruses and acid reflux are treated by controlling symptoms or eliminating the cause. An example might be a cough caused by taking certain blood pressure medications. You stop the cough by changing the medication. Another example might be a cough caused by acid reflux. Controlling the reflux helps control the cough.  USE OF BRONCHODILATOR ("RESCUE") INHALERS: There is a risk from using your bronchodilator too frequently.  The risk is that over-reliance on a medication which only relaxes the muscles surrounding the breathing tubes can reduce the effectiveness of medications prescribed to reduce swelling and congestion of the tubes themselves.  Although you feel brief relief from the bronchodilator inhaler, your asthma may actually be worsening with the tubes becoming more swollen and filled with mucus.  This can delay other crucial treatments, such as oral steroid medications. If you need to use a bronchodilator inhaler daily, several times per day, you should discuss this with your provider.  There are probably better treatments that could be used to keep your asthma under control.     HOME CARE Only take medications as instructed by your  medical team. Complete the entire course of an antibiotic. Drink plenty of fluids and get plenty of rest. Avoid close contacts especially the very young and the elderly Cover your mouth if you cough or cough into your sleeve. Always remember to wash your hands A steam or ultrasonic humidifier can help congestion.   GET HELP RIGHT AWAY IF: You develop worsening fever. You become short of breath You cough up blood. Your symptoms persist after you have completed your treatment plan MAKE SURE YOU  Understand these instructions. Will watch your condition. Will get help right away if you are not doing well or get worse.    Thank you for choosing an e-visit.  Your e-visit answers were reviewed by a board certified advanced clinical practitioner to complete your personal care plan. Depending upon the condition, your plan could have included both over the counter or prescription medications.  Please review your pharmacy choice. Make sure the pharmacy is open so you can pick up prescription now. If there is a problem, you may contact your provider through [employer] and have the prescription routed to another pharmacy.  Your safety is important to [place]. If you have drug allergies check your prescription carefully.   For the next 24 hours you can use MyChart to ask questions about today's visit, request a non-urgent call back, or ask for a work or school excuse. You will get an email in the next two days asking about your experience. I hope that your e-visit has been valuable and will speed your recovery.    have provided 5  minutes of non face to face time during this encounter for chart review and documentation.

## 2022-11-22 ENCOUNTER — Telehealth: Payer: 59 | Admitting: Physician Assistant

## 2022-11-22 DIAGNOSIS — B999 Unspecified infectious disease: Secondary | ICD-10-CM

## 2022-11-22 DIAGNOSIS — R079 Chest pain, unspecified: Secondary | ICD-10-CM

## 2022-11-22 NOTE — Progress Notes (Signed)
Because of persistent and worsening symptoms despite treatment via e-visit, with the addition of chest pain, I feel your condition warrants further evaluation and I recommend that you be seen in a face to face visit.   NOTE: There will be NO CHARGE for this eVisit   If you are having a true medical emergency please call 911.      For an urgent face to face visit, [place] has eight urgent care centers for your convenience:   NEW!! [facility] Health Urgent Care Center at [facility] Get Driving Directions [phone] [address] [place], [address]    [facility] Health Urgent Care Center at [facility] Get Driving Directions [phone] [address] [place], [place] [address]   [facility] Health Urgent Care Center [facility]) Get Driving Directions [phone] [address] [place], [place] [address]  [facility] Health Urgent Care Center [facility]) Get Driving Directions [phone] [address] [place],  [place]  [address]  [facility] Health Urgent Care Center [facility] - at [employer]  [phone] [address] [place],  [place] [address]   [facility] Health Urgent Care at [facility] Get Driving Directions [phone] [address] [place], [place] [address]   [facility] Health Urgent Care at [facility] Get Driving Directions  [phone] [address] [place], [place] [address]   [facility] Health Urgent Care at [facility] Directions [phone] [address], [address] [place], [place] [address]  Your MyChart E-visit questionnaire answers were reviewed by a board certified advanced clinical practitioner to complete your personal care plan based on your specific symptoms.  Thank you for using e-Visits.

## 2023-01-17 ENCOUNTER — Ambulatory Visit: Payer: 59 | Admitting: Family Medicine

## 2023-07-05 ENCOUNTER — Encounter: Payer: Self-pay | Admitting: Family Medicine

## 2023-07-05 ENCOUNTER — Telehealth: Payer: 59 | Admitting: Family Medicine

## 2023-07-05 ENCOUNTER — Other Ambulatory Visit: Payer: Self-pay | Admitting: Family Medicine

## 2023-07-05 DIAGNOSIS — R41840 Attention and concentration deficit: Secondary | ICD-10-CM | POA: Diagnosis not present

## 2023-07-05 DIAGNOSIS — L659 Nonscarring hair loss, unspecified: Secondary | ICD-10-CM | POA: Diagnosis not present

## 2023-07-05 MED ORDER — FINASTERIDE 1 MG PO TABS: 1.0000 mg | ORAL_TABLET | Freq: Every day | ORAL | 3 refills | Status: DC

## 2023-07-05 MED ORDER — FINASTERIDE 5 MG PO TABS: 2.5000 mg | ORAL_TABLET | Freq: Every day | ORAL | 1 refills | Status: DC

## 2023-07-05 NOTE — Progress Notes (Signed)
Chief Complaint  Patient presents with   Hair/Scalp Problem    Hair problem     Devin Steuer is a 22 y.o. male here for a skin complaint. We are interacting via web portal for an electronic face-to-face visit. I verified patient's ID using 2 identifiers. Patient agreed to proceed with visit via this method. Patient is at work, I am at office. Patient and I are present for visit.   Duration: 6 months Location: Hair thinning on the scalp centrally He does have a family history of male pattern baldness. Pruritic? No Painful? No Drainage? No New soaps/lotions/topicals/detergents? No Other associated symptoms: Some scaling of the scalp. Therapies tried thus far: Topical minoxidil and protein supplementation  Patient has a longstanding history of inattention.  He would become easily distracted.  He is applying to dental school and is wondering if something else is going on.  Denies any anxiety or depression.  Past Medical History:  Diagnosis Date   Asthma    Migraine    No conversational dyspnea Age appropriate judgment and insight Nml affect and mood  Hair thinning - Plan: finasteride (PROPECIA) 1 MG tablet  Inattention - Plan: Ambulatory referral to Psychology  Chronic, not controlled.  Start finasteride 1 mg daily.  Avoid caustic substances in the hair.  We discussed how there is a likely genetic component and nothing can be done to truly fix this issue. [facility] to [facility]. F/u in 2 months to recheck. The patient voiced understanding and agreement to the plan.  Jilda Roche [place], DO 07/05/23 12:33 PM

## 2023-07-11 ENCOUNTER — Telehealth: Payer: Self-pay | Admitting: Family Medicine

## 2023-07-26 ENCOUNTER — Telehealth: Payer: Self-pay | Admitting: Family Medicine

## 2023-07-26 NOTE — Telephone Encounter (Signed)
[facility] called stating the location that the referral was routed to doesn't not have ADHD testing and needed to be rerouted to the following office:  [facility] [address], [place], [place] [address] P: [phone] F: [phone]  Rep stated they can be with the same providers but it has to be routed to this location.

## 2023-07-27 NOTE — Telephone Encounter (Signed)
Referral sent to:  [facility] [address], [place], [place] [address] P: [phone] F: [phone]

## 2023-08-01 ENCOUNTER — Other Ambulatory Visit: Payer: Self-pay | Admitting: Family Medicine

## 2023-08-25 ENCOUNTER — Telehealth: Payer: 59 | Admitting: Family Medicine

## 2023-08-25 DIAGNOSIS — J069 Acute upper respiratory infection, unspecified: Secondary | ICD-10-CM | POA: Diagnosis not present

## 2023-08-26 MED ORDER — FLUTICASONE PROPIONATE 50 MCG/ACT NA SUSP: 2.0000 | Freq: Every day | NASAL | 6 refills | Status: DC

## 2023-08-26 MED ORDER — BENZONATATE 200 MG PO CAPS: 200.0000 mg | ORAL_CAPSULE | Freq: Two times a day (BID) | ORAL | 0 refills | Status: DC | PRN

## 2023-08-26 NOTE — Progress Notes (Signed)

## 2023-10-16 ENCOUNTER — Telehealth: Admitting: Physician Assistant

## 2023-10-16 DIAGNOSIS — J069 Acute upper respiratory infection, unspecified: Secondary | ICD-10-CM

## 2023-10-16 MED ORDER — FLUTICASONE PROPIONATE 50 MCG/ACT NA SUSP: 2.0000 | Freq: Every day | NASAL | 6 refills | Status: AC

## 2023-10-16 MED ORDER — BENZONATATE 200 MG PO CAPS: 200.0000 mg | ORAL_CAPSULE | Freq: Two times a day (BID) | ORAL | 0 refills | Status: DC | PRN

## 2023-10-16 NOTE — Progress Notes (Signed)

## 2023-10-31 ENCOUNTER — Telehealth (INDEPENDENT_AMBULATORY_CARE_PROVIDER_SITE_OTHER): Admitting: Family Medicine

## 2023-10-31 ENCOUNTER — Other Ambulatory Visit: Payer: Self-pay | Admitting: Family Medicine

## 2023-10-31 ENCOUNTER — Encounter: Payer: Self-pay | Admitting: Family Medicine

## 2023-10-31 DIAGNOSIS — J4521 Mild intermittent asthma with (acute) exacerbation: Secondary | ICD-10-CM

## 2023-10-31 MED ORDER — FLUTICASONE PROPIONATE HFA 110 MCG/ACT IN AERO: INHALATION_SPRAY | RESPIRATORY_TRACT | 1 refills | Status: DC

## 2023-10-31 MED ORDER — LEVALBUTEROL TARTRATE 45 MCG/ACT IN AERO: 1.0000 | INHALATION_SPRAY | Freq: Four times a day (QID) | RESPIRATORY_TRACT | 12 refills | Status: AC | PRN

## 2023-10-31 NOTE — Progress Notes (Signed)
 Chief Complaint  Patient presents with   Cough    Patient presents today for a cough w/ mucus.    Devin Robinson here for URI complaints. We are interacting via web portal for an electronic face-to-face visit. I verified patient's ID using 2 identifiers. Patient agreed to proceed with visit via this method. Patient is at home, I am at office. Patient and I are present for visit.   Duration: 2 months  Associated symptoms: productive cough, stuffy nose.  Denies: sinus pain, rhinorrhea, itchy watery eyes, ear pain, ear drainage, sore throat, wheezing, shortness of breath, myalgia, and fevers Hx of asthma, coughing prior to this. Has not had inhaler.  Had the flu preceding this.  Treatment to date: Mucinex Sick contacts: No  Past Medical History:  Diagnosis Date   Asthma    Migraine     Objective No conversational dyspnea Age appropriate judgment and insight Nml affect and mood  Mild intermittent asthma with acute exacerbation - Plan: levalbuterol (XOPENEX HFA) 45 MCG/ACT inhaler, fluticasone (FLOVENT HFA) 110 MCG/ACT inhaler  Chronic issue that isn't controlled. Refill SABA. Add ICS for daily prn usage for flares/yellow zone. Rinse mouth out after use. Continue to push fluids, practice good hand hygiene, cover mouth when coughing. F/u prn. If starting to experience fevers, shaking, or shortness of breath, seek immediate care. Pt voiced understanding and agreement to the plan.  Jilda Roche [place], DO 10/31/23 11:02 AM

## 2023-11-02 ENCOUNTER — Other Ambulatory Visit (HOSPITAL_COMMUNITY): Payer: Self-pay

## 2023-11-02 NOTE — Telephone Encounter (Signed)
 Pharmacy Patient Advocate Encounter   Received notification from Physician's Office that prior authorization for ARNUITY ELLIPTA 100 MCG is required/requested.   Insurance verification completed.   The patient is insured through  [employer]  .   Per test claim: The current 30 day co-pay is, $15.00.  No PA needed at this time. This test claim was processed through [facility]- copay amounts may vary at other pharmacies due to pharmacy/plan contracts, or as the patient moves through the different stages of their insurance plan.

## 2023-11-03 ENCOUNTER — Other Ambulatory Visit (HOSPITAL_BASED_OUTPATIENT_CLINIC_OR_DEPARTMENT_OTHER): Payer: Self-pay

## 2023-11-03 ENCOUNTER — Other Ambulatory Visit: Payer: Self-pay | Admitting: *Deleted

## 2023-11-03 DIAGNOSIS — J4521 Mild intermittent asthma with (acute) exacerbation: Secondary | ICD-10-CM

## 2023-11-03 MED ORDER — FLUTICASONE PROPIONATE HFA 110 MCG/ACT IN AERO
2.0000 | INHALATION_SPRAY | Freq: Two times a day (BID) | RESPIRATORY_TRACT | 1 refills | Status: AC | PRN
  Filled 2023-11-03: qty 12, 30d supply, fill #0

## 2023-11-07 ENCOUNTER — Other Ambulatory Visit (HOSPITAL_BASED_OUTPATIENT_CLINIC_OR_DEPARTMENT_OTHER): Payer: Self-pay

## 2023-11-07 ENCOUNTER — Other Ambulatory Visit (HOSPITAL_COMMUNITY): Payer: Self-pay

## 2023-11-07 ENCOUNTER — Telehealth: Payer: Self-pay | Admitting: Pharmacy Technician

## 2023-11-07 NOTE — Telephone Encounter (Signed)
 Pharmacy Patient Advocate Encounter   Received notification from CoverMyMeds that prior authorization for Fluticasone Propionate HFA 110MCG/ACT aerosol is required/requested.   Insurance verification completed.   The patient is insured through [facility] .   Per test claim: PA required; PA submitted to above mentioned insurance via CoverMyMeds Key/confirmation #/EOC [id number] Status is pending

## 2023-11-07 NOTE — Telephone Encounter (Signed)
 Pharmacy Patient Advocate Encounter  Received notification from [facility] that Prior Authorization for Fluticasone Propionate HFA 110MCG/ACT aerosol has been DENIED.  Full denial letter will be uploaded to the media tab. See denial reason below.   PA #/Case ID/Reference #: [id number]

## 2023-11-08 ENCOUNTER — Other Ambulatory Visit (HOSPITAL_BASED_OUTPATIENT_CLINIC_OR_DEPARTMENT_OTHER): Payer: Self-pay

## 2023-11-09 ENCOUNTER — Other Ambulatory Visit (HOSPITAL_BASED_OUTPATIENT_CLINIC_OR_DEPARTMENT_OTHER): Payer: Self-pay

## 2023-11-09 ENCOUNTER — Other Ambulatory Visit: Payer: Self-pay | Admitting: Family Medicine

## 2023-11-09 MED ORDER — QVAR REDIHALER 40 MCG/ACT IN AERB
2.0000 | INHALATION_SPRAY | Freq: Two times a day (BID) | RESPIRATORY_TRACT | 2 refills | Status: AC
Start: 2023-11-09 — End: ?
  Filled 2023-11-09: qty 10.6, 30d supply, fill #0

## 2023-11-09 MED ORDER — ASMANEX (60 METERED DOSES) 220 MCG/ACT IN AEPB
2.0000 | INHALATION_SPRAY | Freq: Every day | RESPIRATORY_TRACT | 2 refills | Status: DC
  Filled 2023-11-09: qty 1, 30d supply, fill #0

## 2023-11-21 ENCOUNTER — Other Ambulatory Visit (HOSPITAL_BASED_OUTPATIENT_CLINIC_OR_DEPARTMENT_OTHER): Payer: Self-pay

## 2024-02-07 ENCOUNTER — Other Ambulatory Visit: Payer: Self-pay | Admitting: Family Medicine

## 2024-08-05 ENCOUNTER — Emergency Department (HOSPITAL_BASED_OUTPATIENT_CLINIC_OR_DEPARTMENT_OTHER)

## 2024-08-05 ENCOUNTER — Encounter (HOSPITAL_BASED_OUTPATIENT_CLINIC_OR_DEPARTMENT_OTHER): Payer: Self-pay | Admitting: Emergency Medicine

## 2024-08-05 ENCOUNTER — Emergency Department (HOSPITAL_BASED_OUTPATIENT_CLINIC_OR_DEPARTMENT_OTHER)
Admission: EM | Admit: 2024-08-05 | Discharge: 2024-08-05 | Disposition: A | Discharge status: Home / Self Care | Source: Ambulatory Visit | Attending: Emergency Medicine | Admitting: Emergency Medicine

## 2024-08-05 ENCOUNTER — Other Ambulatory Visit: Payer: Self-pay

## 2024-08-05 DIAGNOSIS — R1013 Epigastric pain: Secondary | ICD-10-CM | POA: Diagnosis present

## 2024-08-05 HISTORY — DX: Other specified bacterial intestinal infections: A04.8

## 2024-08-05 LAB — CBC WITH DIFFERENTIAL/PLATELET
Abs Immature Granulocytes: 0.03 K/uL (ref 0.00–0.07)
Basophils Absolute: 0 K/uL (ref 0.0–0.1)
Basophils Relative: 0 %
Eosinophils Absolute: 0.3 K/uL (ref 0.0–0.5)
Eosinophils Relative: 3 %
HCT: 39.5 % (ref 39.0–52.0)
Hemoglobin: 13.2 g/dL (ref 13.0–17.0)
Immature Granulocytes: 0 %
Lymphocytes Relative: 37 %
Lymphs Abs: 3.7 K/uL (ref 0.7–4.0)
MCH: 27.6 pg (ref 26.0–34.0)
MCHC: 33.4 g/dL (ref 30.0–36.0)
MCV: 82.5 fL (ref 80.0–100.0)
Monocytes Absolute: 1.3 K/uL — ABNORMAL HIGH (ref 0.1–1.0)
Monocytes Relative: 14 %
Neutro Abs: 4.5 K/uL (ref 1.7–7.7)
Neutrophils Relative %: 46 %
Platelets: 256 K/uL (ref 150–400)
RBC: 4.79 MIL/uL (ref 4.22–5.81)
RDW: 12.4 % (ref 11.5–15.5)
WBC: 9.9 K/uL (ref 4.0–10.5)
nRBC: 0 % (ref 0.0–0.2)

## 2024-08-05 LAB — COMPREHENSIVE METABOLIC PANEL WITH GFR
ALT: 28 U/L (ref 0–44)
AST: 36 U/L (ref 15–41)
Albumin: 5.2 g/dL — ABNORMAL HIGH (ref 3.5–5.0)
Alkaline Phosphatase: 98 U/L (ref 38–126)
Anion gap: 13 (ref 5–15)
BUN: 13 mg/dL (ref 6–20)
CO2: 28 mmol/L (ref 22–32)
Calcium: 9.9 mg/dL (ref 8.9–10.3)
Chloride: 99 mmol/L (ref 98–111)
Creatinine, Ser: 1.02 mg/dL (ref 0.61–1.24)
GFR, Estimated: >60 mL/min
Glucose, Bld: 103 mg/dL — ABNORMAL HIGH (ref 70–99)
Potassium: 4.3 mmol/L (ref 3.5–5.1)
Sodium: 140 mmol/L (ref 135–145)
Total Bilirubin: 0.3 mg/dL (ref 0.0–1.2)
Total Protein: 8.3 g/dL — ABNORMAL HIGH (ref 6.5–8.1)

## 2024-08-05 LAB — LIPASE, BLOOD: Lipase: 29 U/L (ref 11–51)

## 2024-08-05 MED ORDER — IOHEXOL 300 MG/ML  SOLN
100.0000 mL | Freq: Once | INTRAMUSCULAR | Status: AC | PRN
  Administered 2024-08-05: 100 mL via INTRAVENOUS

## 2024-08-05 MED ORDER — ONDANSETRON HCL 4 MG/2ML IJ SOLN
4.0000 mg | Freq: Once | INTRAMUSCULAR | Status: AC
  Administered 2024-08-05: 4 mg via INTRAVENOUS
  Filled 2024-08-05: qty 2

## 2024-08-05 MED ORDER — ALUM & MAG HYDROXIDE-SIMETH 200-200-20 MG/5ML PO SUSP
30.0000 mL | Freq: Once | ORAL | Status: AC
  Administered 2024-08-05: 30 mL via ORAL
  Filled 2024-08-05: qty 30

## 2024-08-05 MED ORDER — OMEPRAZOLE 20 MG PO CPDR: 20.0000 mg | DELAYED_RELEASE_CAPSULE | Freq: Every day | ORAL | 0 refills | Status: AC

## 2024-08-05 MED ORDER — ONDANSETRON 8 MG PO TBDP: ORAL_TABLET | ORAL | 0 refills | Status: AC

## 2024-08-05 MED ORDER — FAMOTIDINE IN NACL 20-0.9 MG/50ML-% IV SOLN
20.0000 mg | Freq: Once | INTRAVENOUS | Status: AC
  Administered 2024-08-05: 20 mg via INTRAVENOUS
  Filled 2024-08-05: qty 50

## 2024-08-05 MED ORDER — FENTANYL CITRATE (PF) 50 MCG/ML IJ SOSY
50.0000 ug | PREFILLED_SYRINGE | Freq: Once | INTRAMUSCULAR | Status: AC
  Administered 2024-08-05: 50 ug via INTRAVENOUS
  Filled 2024-08-05: qty 1

## 2024-08-05 MED ORDER — KETOROLAC TROMETHAMINE 30 MG/ML IJ SOLN
15.0000 mg | Freq: Once | INTRAMUSCULAR | Status: AC
  Administered 2024-08-05: 15 mg via INTRAVENOUS
  Filled 2024-08-05: qty 1

## 2024-08-05 MED ORDER — DICYCLOMINE HCL 10 MG/ML IM SOLN
20.0000 mg | Freq: Once | INTRAMUSCULAR | Status: AC
  Administered 2024-08-05: 20 mg via INTRAMUSCULAR
  Filled 2024-08-05: qty 2

## 2024-08-05 NOTE — ED Triage Notes (Signed)
 Hx of UC and H pylori. Pt reports waking up to severe epigastric pain and vomiting. Pt appears pale and in significant pain. EDP at bedside.

## 2024-08-05 NOTE — ED Provider Notes (Signed)
 " [facility] Provider Note   CSN: [id number] Arrival date & time: 08/05/24  0330     Patient presents with: Abdominal Pain   Devin Robinson is a 23 y.o. male.   The history is provided by the patient.  Abdominal Pain Pain location:  Epigastric Pain radiates to:  Does not radiate Pain severity:  Severe Onset quality:  Sudden Timing:  Constant Progression:  Unchanged Chronicity:  New Context: not trauma   Relieved by:  Nothing Worsened by:  Nothing Ineffective treatments:  None tried Associated symptoms: nausea and vomiting   Associated symptoms: no chest pain and no fever   Risk factors: not elderly and no NSAID use   Patient with history of H. Pylori and was hospitalized for colitis in [place] abroad presents with epigastric pain and nausea and vomiting.  No diarrhea.  No fevers.       Prior to Admission medications  Medication Sig Start Date End Date Taking? Authorizing Provider  omeprazole  (PRILOSEC) 20 MG capsule Take 1 capsule (20 mg total) by mouth daily. 08/05/24  Yes Amauri Keefe, MD  beclomethasone (QVAR  REDIHALER) 40 MCG/ACT inhaler Inhale 2 puffs into the lungs 2 (two) times daily. 11/09/23   Frann Mabel Mt, DO  finasteride  (PROSCAR ) 5 MG tablet TAKE 1/2 TABLET BY MOUTH DAILY 08/02/23   Frann Mabel Mt, DO  fluticasone  (FLONASE ) 50 MCG/ACT nasal spray Place 2 sprays into both nostrils daily. 10/16/23   Mayers, Cari S, PA-C  fluticasone  (FLOVENT  HFA) 110 MCG/ACT inhaler Inhale 2 puffs into the lungs 2 (two) times daily as needed. Rinse mouth out after each use. 11/03/23   Wendling, Mabel Mt, DO  levalbuterol  (XOPENEX  HFA) 45 MCG/ACT inhaler Inhale 1-2 puffs into the lungs every 6 (six) hours as needed for wheezing or shortness of breath. 10/31/23   Frann Mabel Mt, DO  zolmitriptan  (ZOMIG ) 5 MG tablet Take 1 tablet (5 mg total) by mouth as needed for migraine. Do not exceed 2 tabs  in 24 hours. 08/26/20 09/29/20  Frann Mabel Mt, DO    Allergies: Patient has no known allergies.    Review of Systems  Constitutional:  Negative for fever.  Respiratory:  Negative for wheezing and stridor.   Cardiovascular:  Negative for chest pain.  Gastrointestinal:  Positive for abdominal pain, nausea and vomiting.  Neurological:  Negative for light-headedness.  All other systems reviewed and are negative.   Updated Vital Signs BP (!) 147/99   Pulse 96   Temp 99.1 F (37.3 C)   Resp 14   Ht 5' 10 (1.778 m)   Wt 110.2 kg   SpO2 100%   BMI 34.87 kg/m   Physical Exam Vitals and nursing note reviewed. Exam conducted with a chaperone present.  Constitutional:      General: He is not in acute distress.    Appearance: He is well-developed. He is not diaphoretic.  HENT:     Head: Normocephalic and atraumatic.     Nose: Nose normal.  Eyes:     Conjunctiva/sclera: Conjunctivae normal.     Pupils: Pupils are equal, round, and reactive to light.  Cardiovascular:     Rate and Rhythm: Normal rate and regular rhythm.     Pulses: Normal pulses.     Heart sounds: Normal heart sounds.  Pulmonary:     Effort: Pulmonary effort is normal.     Breath sounds: Normal breath sounds. No wheezing or rales.  Abdominal:  General: Bowel sounds are normal.     Palpations: Abdomen is soft.     Tenderness: There is no abdominal tenderness. There is no guarding or rebound. Negative signs include Murphy's sign and McBurney's sign.  Musculoskeletal:        General: Normal range of motion.     Cervical back: Normal range of motion and neck supple.  Skin:    General: Skin is warm and dry.     Capillary Refill: Capillary refill takes less than 2 seconds.  Neurological:     General: No focal deficit present.     Mental Status: He is alert and oriented to person, place, and time.     Deep Tendon Reflexes: Reflexes normal.  Psychiatric:        Thought Content: Thought content normal.      (all labs ordered are listed, but only abnormal results are displayed) Results for orders placed or performed during the hospital encounter of 08/05/24  CBC with Differential   Collection Time: 08/05/24  3:44 AM  Result Value Ref Range   WBC 9.9 4.0 - 10.5 K/uL   RBC 4.79 4.22 - 5.81 MIL/uL   Hemoglobin 13.2 13.0 - 17.0 g/dL   HCT 60.4 60.9 - 47.9 %   MCV 82.5 80.0 - 100.0 fL   MCH 27.6 26.0 - 34.0 pg   MCHC 33.4 30.0 - 36.0 g/dL   RDW 87.5 88.4 - 84.4 %   Platelets 256 150 - 400 K/uL   nRBC 0.0 0.0 - 0.2 %   Neutrophils Relative % 46 %   Neutro Abs 4.5 1.7 - 7.7 K/uL   Lymphocytes Relative 37 %   Lymphs Abs 3.7 0.7 - 4.0 K/uL   Monocytes Relative 14 %   Monocytes Absolute 1.3 (H) 0.1 - 1.0 K/uL   Eosinophils Relative 3 %   Eosinophils Absolute 0.3 0.0 - 0.5 K/uL   Basophils Relative 0 %   Basophils Absolute 0.0 0.0 - 0.1 K/uL   Immature Granulocytes 0 %   Abs Immature Granulocytes 0.03 0.00 - 0.07 K/uL  Comprehensive metabolic panel   Collection Time: 08/05/24  3:44 AM  Result Value Ref Range   Sodium 140 135 - 145 mmol/L   Potassium 4.3 3.5 - 5.1 mmol/L   Chloride 99 98 - 111 mmol/L   CO2 28 22 - 32 mmol/L   Glucose, Bld 103 (H) 70 - 99 mg/dL   BUN 13 6 - 20 mg/dL   Creatinine, Ser 8.97 0.61 - 1.24 mg/dL   Calcium 9.9 8.9 - 89.6 mg/dL   Total Protein 8.3 (H) 6.5 - 8.1 g/dL   Albumin 5.2 (H) 3.5 - 5.0 g/dL   AST 36 15 - 41 U/L   ALT 28 0 - 44 U/L   Alkaline Phosphatase 98 38 - 126 U/L   Total Bilirubin 0.3 0.0 - 1.2 mg/dL   GFR, Estimated >39 >39 mL/min   Anion gap 13 5 - 15  Lipase, blood   Collection Time: 08/05/24  3:44 AM  Result Value Ref Range   Lipase 29 11 - 51 U/L   CT ABDOMEN PELVIS W CONTRAST Result Date: 08/05/2024 EXAM: CT ABDOMEN AND PELVIS WITH CONTRAST 08/05/2024 04:51:00 AM TECHNIQUE: CT of the abdomen and pelvis was performed with the administration of 100 mL of iohexol  (OMNIPAQUE ) 300 MG/ML solution. Multiplanar reformatted images are  provided for review. Automated exposure control, iterative reconstruction, and/or weight-based adjustment of the mA/kV was utilized to reduce the radiation dose to  as low as reasonably achievable. COMPARISON: None available. CLINICAL HISTORY: Polytrauma, blunt. FINDINGS: LOWER CHEST: No acute abnormality. LIVER: The liver is unremarkable. GALLBLADDER AND BILE DUCTS: Gallbladder is unremarkable. No biliary ductal dilatation. SPLEEN: No acute abnormality. PANCREAS: No acute abnormality. ADRENAL GLANDS: No acute abnormality. KIDNEYS, URETERS AND BLADDER: No stones in the kidneys or ureters. No hydronephrosis. No perinephric or periureteral stranding. Urinary bladder is unremarkable. GI AND BOWEL: Stomach demonstrates no acute abnormality. There is no bowel obstruction. PERITONEUM AND RETROPERITONEUM: No ascites. No free air. VASCULATURE: Aorta is normal in caliber. LYMPH NODES: There are mildly prominent mesenteric and retroperitoneal lymph nodes on the right, which may be related to the patient's ulcerative colitis. REPRODUCTIVE ORGANS: No acute abnormality. BONES AND SOFT TISSUES: No acute osseous abnormality. No focal soft tissue abnormality. There is no evidence of acute traumatic injury. IMPRESSION: 1. No evidence of acute traumatic injury. 2. Mildly prominent right mesenteric and retroperitoneal lymph nodes, possibly related to the patient's ulcerative colitis. Electronically signed by: Evalene Coho MD 08/05/2024 05:10 AM EST RP Workstation: [id number]    EKG: EKG Interpretation Date/Time:  Sunday August 05 2024 03:43:12 EST Ventricular Rate:  99 PR Interval:  155 QRS Duration:  112 QT Interval:  345 QTC Calculation: 443 R Axis:   51  Text Interpretation: Sinus rhythm Baseline wander in lead(s) II III aVF V3 Confirmed by Nettie, Leeana Creer ([id number]) on 08/05/2024 3:45:20 AM  Radiology: CT ABDOMEN PELVIS W CONTRAST Result Date: 08/05/2024 EXAM: CT ABDOMEN AND PELVIS WITH CONTRAST 08/05/2024  04:51:00 AM TECHNIQUE: CT of the abdomen and pelvis was performed with the administration of 100 mL of iohexol  (OMNIPAQUE ) 300 MG/ML solution. Multiplanar reformatted images are provided for review. Automated exposure control, iterative reconstruction, and/or weight-based adjustment of the mA/kV was utilized to reduce the radiation dose to as low as reasonably achievable. COMPARISON: None available. CLINICAL HISTORY: Polytrauma, blunt. FINDINGS: LOWER CHEST: No acute abnormality. LIVER: The liver is unremarkable. GALLBLADDER AND BILE DUCTS: Gallbladder is unremarkable. No biliary ductal dilatation. SPLEEN: No acute abnormality. PANCREAS: No acute abnormality. ADRENAL GLANDS: No acute abnormality. KIDNEYS, URETERS AND BLADDER: No stones in the kidneys or ureters. No hydronephrosis. No perinephric or periureteral stranding. Urinary bladder is unremarkable. GI AND BOWEL: Stomach demonstrates no acute abnormality. There is no bowel obstruction. PERITONEUM AND RETROPERITONEUM: No ascites. No free air. VASCULATURE: Aorta is normal in caliber. LYMPH NODES: There are mildly prominent mesenteric and retroperitoneal lymph nodes on the right, which may be related to the patient's ulcerative colitis. REPRODUCTIVE ORGANS: No acute abnormality. BONES AND SOFT TISSUES: No acute osseous abnormality. No focal soft tissue abnormality. There is no evidence of acute traumatic injury. IMPRESSION: 1. No evidence of acute traumatic injury. 2. Mildly prominent right mesenteric and retroperitoneal lymph nodes, possibly related to the patient's ulcerative colitis. Electronically signed by: Evalene Coho MD 08/05/2024 05:10 AM EST RP Workstation: [id number]     Procedures   Medications Ordered in the ED  ketorolac  (TORADOL ) 30 MG/ML injection 15 mg (has no administration in time range)  famotidine  (PEPCID ) IVPB 20 mg premix (0 mg Intravenous Stopped 08/05/24 0432)  fentaNYL  (SUBLIMAZE ) injection 50 mcg (50 mcg Intravenous Given  08/05/24 0356)  ondansetron  (ZOFRAN ) injection 4 mg (4 mg Intravenous Given 08/05/24 0357)  iohexol  (OMNIPAQUE ) 300 MG/ML solution 100 mL (100 mLs Intravenous Contrast Given 08/05/24 0451)  alum & mag hydroxide-simeth (MAALOX/MYLANTA) 200-200-20 MG/5ML suspension 30 mL (30 mLs Oral Given 08/05/24 0504)  Medical Decision Making Patient with epigastric pain sudden onset with nausea and emesis.    Amount and/or Complexity of Data Reviewed Independent Historian: spouse    Details: See above  External Data Reviewed: labs and notes.    Details: Previous notes including colonoscopy reviewed.  Biopsies were negative for acute pathology.   Labs: ordered.    Details: Normal white count 9.9 normal hemoglobin 13.2, normal platelets.  Normal sodium 140, normal potassium 4.3, normal creatinine.  Normal lipase 29. Normal AST and ALT Radiology: ordered and independent interpretation performed.    Details: CT without SBO by me  ECG/medicine tests: ordered and independent interpretation performed.  Risk OTC drugs. Prescription drug management. Risk Details: Patient is well appearing.  Normal exam, labs and imaging.  Will start zofran  and PPI.  Will have patient follow up with Gastroenterology for ongoing care.      Final diagnoses:  Epigastric pain    No signs of systemic illness or infection. The patient is nontoxic-appearing on exam and vital signs are within normal limits.  I have reviewed the triage vital signs and the nursing notes. Pertinent labs & imaging results that were available during my care of the patient were reviewed by me and considered in my medical decision making (see chart for details). After history, exam, and medical workup I feel the patient has been appropriately medically screened and is safe for discharge home. Pertinent diagnoses were discussed with the patient. Patient was given return precautions.    ED Discharge Orders           Ordered    omeprazole  (PRILOSEC) 20 MG capsule  Daily        08/05/24 0517               Brison Fiumara, MD 08/05/24 9446  "

## 2024-08-12 NOTE — ED Provider Notes (Signed)
 " [facility]  ED Provider Note  Devin Robinson 23 y.o. male DOB: 07-02-2001 MRN: [id number] History   Chief Complaint  Patient presents with   Chest Pain    Mid chest pain/epigastric pain with N/V x30 mins. Similar episode on 12/21, went to [facility] ED, referred to GI. Has not followed up yet.   Patient presents with a chief complaint of chest and severe epigastric pain and associated nausea and vomiting.  Wife provides most of history as patient is extremely uncomfortable, groaning in pain, sitting upright.  She states that he was admitted to the hospital for this several days ago, underwent imaging and was referred to gastroenterology but they did not find a specific cause of the pain.  He denies urinary symptoms or fever.  Emesis is frequent, nonbloody and nonbilious.   Chest Pain      No past medical history on file.  No past surgical history on file.  Social History   Substance and Sexual Activity  Alcohol Use Not on file   Tobacco Use History[1] E-Cigarettes   Vaping Use     Start Date     Cartridges/Day     Quit Date     Social History   Substance and Sexual Activity  Drug Use Not on file         Allergies[2]  Home Medications   No medications on file    Primary Survey  Primary Survey  Review of Systems   Review of Systems  Cardiovascular:  Positive for chest pain.  All other systems reviewed and are negative.   Physical Exam   ED Triage Vitals [08/12/24 1747]  BP 142/85  Heart Rate (!) 126  Resp 24  SpO2 100 %  Temp 97.7 F (36.5 C)    Physical Exam  Constitutional: He appears well-developed and well-nourished. He appears to be in pain, appears distressed and appears ill.  HENT:  Head: Normocephalic and atraumatic.  Eyes: EOM are intact. Pupils are equal, round, and reactive to light.  Neck: Normal range of motion. Neck supple.  Cardiovascular: Normal rate and regular rhythm.  Pulmonary/Chest:  Respiratory effort normal and breath sounds normal.  Abdominal: Soft. There is severe generalized abdominal tenderness and tenderness in the epigastric area. There is guarding.  Musculoskeletal: Normal range of motion.     Cervical back: Normal range of motion and neck supple. no edema.  Neurological: He is alert and oriented to person, place, and time. Moves all extremities equally. He has normal speech.  Skin: Skin is warm. Skin is dry.     ED Course   Lab results: No data to display  Imaging: No data to display   ECG: ECG Results          ECG 12 lead (In process)      Collection Time Result Time Acquisition Device Ventricular Rate Atrial Rate P-R Interval QRS Duration Q-T Interval QTC Calculation(Bazett) Calculated P Axis Calculated R Axis Calculated T Axis   08/12/24 17:39:26 08/12/24 17:52:12 D3K 115 115 156 102 348 481 67 75 58         Collection Time Result Time ECGDiag   08/12/24 17:39:26 08/12/24 17:52:12 Sinus tachycardia Incomplete right bundle branch block Borderline ECG No previous ECGs available  Pre-Sedation Procedures  ED Course as of 08/13/24 0032  Dorothyann HERO [facility] Documentation  Sun Aug 12, 2024  1914 Lipase(!): >3,000  2102 CT Chest Abdomen Pelvis W IV Contrast Clinically, do suspect possible concomitant cholecystitis. RUQ US  ordered to further clarify  2207 US  RUQ IMPRESSION: 1. Gallbladder sludge   Electronically Signed by: Glendia Guillaume, MD on 08/12/2024 10:04 PM    Medical Decision Making Patient presents with acute onset of worsening abdominal pain, nausea, vomiting.  Lipase is greater than 3000, reflecting pancreatitis which fully explains patient's symptoms.  Patient also has elevated LFTs.  Given the severity of his abdominal pain, query possible concomitant cholecystitis as his pancreatitis  is likely secondary to gallstones.  Right upper quadrant ultrasound obtained and is reassuring against cholecystitis at this time.  Admitted for further management.  Amount and/or Complexity of Data Reviewed Labs: ordered. Decision-making details documented in ED Course. Radiology: ordered. Decision-making details documented in ED Course. ECG/medicine tests: ordered.  Risk Prescription drug management. Decision regarding hospitalization.          Provider Communication  New Prescriptions   No medications on file    Modified Medications   No medications on file    Discontinued Medications   No medications on file    Clinical Impression Final diagnoses:  None    ED Disposition     None                 Electronically signed by:       [1] Social History Tobacco Use  Smoking Status Not on file  Smokeless Tobacco Not on file  [2] No Known Allergies  Dorothyann HERO [place], MD 08/13/24 0034  "

## 2024-08-12 NOTE — H&P (Signed)
 [facility]  History and Physical   Assessment   Patient is a 23 year old male, with a no significant PMH, who presents to the hospital for Acute Cholecystis and Acute Pancreatitis   Plan   # Acute Acalculous Cholecystis-  CT Ap was concerning for distended gall bladder. AST is elevated at  604, and ALT is elevated at 289 with elevated T.Bili of 3.1. Concern for acute cholecystis given WBC count elevation of 15.8 . Will start IV zosyn. Blood cultures pending.  Due to elevated T.Bili, will obtain MRCP to evaluate for Choledocholithiasis and Place General Surgery Consult.  Will trend daily hepatic panel. Procal and lactic acid.   Will trend daily WBC.   # Other Acute Pancreatitis Without Infection or Necrosis - Defined by symptoms, lipase over 3000, and CT AP consistent with acute pancreatitis. Secondary to above.   Will place no NPO status and start D5 LR at 100 cc/hr. Will trend BS every 4 hours to monitor for hypoglycemia. Hypoglycemic protocol ordered   Will start IV Dilaudid  1mg  every 2 hours PRN for pain. PRN narcan ordered and miralax ordered. Will Zofran  4 mg every 8 hours PRN for N/V  Will obtain Lipid panel. Will trend daily Na, K and Mg to monitor for electrolyte derangements due to GI losses.   DVT- SCD Diet- NPO IVF- D5 LR at 100 cc/hr  Expected length of stay greater than 2 midnights admitted to the hospital under inpatient status for  medical complexity including Acute Cholecystis and Acute Pancreatitis Requiring Bowel Rest, IVF, IV Dilaudid, IV Antiemetics, IV ABX, MRCP, and General Surgery Consult       Fluid and electrolyte disorder: N/A, present on admission  Plan: N/A  Abnormal blood counts: N/A, present on admission Plan: N/A  Nutritional status: Body mass index is 34.44 kg/m. N/A Plan: N/A NPO Effective Now Except: Meds, May have ice chips  Coagulation defect: N/A, present on admission Plan: N/A  Elevated  LFTs/transaminitis due to: N/A, present on admission Plan: N/A  Debility: N/A, present on admission Plan: N/A    History   CC- Abdominal Pain  HPI- Patient is a 23 year old male, with a no significant PMH, who presents to the hospital for abdominal pain. Patient has been having increasing sharp stabbing epigastric abdominal pain with nausea and vomiting for the past week. Symptoms became severe on 08-12-2024 prompting him to seek medical care. No fevers, or chills.  No alcohol use.   Personally Reviewed Dr. Prentis Carder Note on 11-04-2023 for Cough  Personally Reviewed CT AP- Distended Gallbladder with Acute Cholecystis, No SBO.   PMH- None  PSH- Tonsillectomy   Meds- None  Social- No alcohol or tobacco use  FH- No history of heart disease- diabetes, or cancer     Allergies[1] Prior to Admission medications  Not on File   Social History[2] Family History[3] Review of Systems  Constitutional:  Negative for chills, fatigue and fever.  HENT:  Negative for ear pain, rhinorrhea and sore throat.   Eyes:  Negative for pain, discharge, itching and visual disturbance.  Respiratory:  Negative for cough, shortness of breath and wheezing.   Cardiovascular:  Negative for chest pain, palpitations and leg swelling.  Gastrointestinal:  Positive for abdominal pain, nausea and vomiting.  Genitourinary:  Negative for dysuria, flank pain and hematuria.  Musculoskeletal:  Negative for arthralgias, back pain and neck pain.  Skin:  Negative for color change, pallor and rash.  Neurological:  Negative  for syncope, weakness and light-headedness.  Psychiatric/Behavioral:  Negative for agitation, behavioral problems and confusion.         Physical Examination  Temp:  [97.7 F (36.5 C)-98.7 F (37.1 C)] 98.7 F (37.1 C) Heart Rate:  [79-126] 90 Resp:  [17-24] 17 BP: (124-149)/(60-85) 132/70 SpO2:  [98 %-100 %] 99 % Pain Score:   8 O2 Device: None (Room air) No data  recorded  Physical Exam Vitals reviewed.  Constitutional:      General: He is not in acute distress.    Appearance: Normal appearance. He is obese. He is not ill-appearing.  HENT:     Head: Normocephalic and atraumatic.  Eyes:     General:        Right eye: No discharge.        Left eye: No discharge.     Extraocular Movements: Extraocular movements intact.     Conjunctiva/sclera: Conjunctivae normal.  Cardiovascular:     Rate and Rhythm: Normal rate and regular rhythm.     Heart sounds: No murmur heard.    No friction rub.  Musculoskeletal:        General: No deformity or signs of injury.     Cervical back: Neck supple. No tenderness.     Right lower leg: No edema.     Left lower leg: No edema.  Pulmonary:     Effort: Pulmonary effort is normal.  Abdominal:     General: Abdomen is flat. Bowel sounds are normal. There is no distension.     Palpations: Abdomen is soft. There is no mass.     Tenderness: There is abdominal tenderness.     Comments: Epigastric tenderness   Lymphadenopathy:     Cervical: No cervical adenopathy.  Skin:    General: Skin is warm and dry.     Coloration: Skin is not jaundiced or pale.     Findings: No bruising.  Neurological:     General: No focal deficit present.     Mental Status: He is alert and oriented to person, place, and time. Mental status is at baseline.     Cranial Nerves: No cranial nerve deficit.  Psychiatric:        Mood and Affect: Mood normal.        Behavior: Behavior normal.        Thought Content: Thought content normal.        Judgment: Judgment normal.   Results  Labs:  Recent Results (from the past 24 hours)  ECG 12 lead   Collection Time: 08/12/24  5:39 PM  Result Value Ref Range   Acquisition Device D3K    Ventricular Rate 115 BPM   Atrial Rate 115 BPM   P-R Interval 156 ms   QRS Duration 102 ms   Q-T Interval 348 ms   QTC Calculation(Bazett) 481 ms   Calculated P Axis 67 degrees   Calculated R Axis 75  degrees   Calculated T Axis 58 degrees   ECG Diagnosis      Sinus tachycardia Incomplete right bundle branch block Borderline ECG No previous ECGs available Normal axis No acute injury pattern Brien Kung ([id number]) on 08/12/2024 7:18:05 PM certifies that he/she has reviewed the ECG tracing and confirms the independent interpretation is correct.   Gen5 Cardiac Troponin T (TnT5) Baseline Series at: baseline, 1 hour, and 3 hour; Onset of symptoms: Less than 3 hours or undetermined   Collection Time: 08/12/24  6:25 PM  Result Value Ref Range  TnT-Gen5 (0hr) 6 <22 ng/L  Magnesium    Collection Time: 08/12/24  6:25 PM  Result Value Ref Range   Mg 2.1 1.6 - 2.6 mg/dL  CBC And Differential   Collection Time: 08/12/24  6:25 PM  Result Value Ref Range   WBC 15.8 (H) 4.0 - 10.5 thou/mcL   RBC 5.18 4.63 - 6.08 million/mcL   HGB 14.0 13.7 - 17.5 gm/dL   HCT 57.5 59.8 - 48.9 %   MCV 81.9 79.0 - 92.2 fL   MCH 27.0 25.7 - 32.2 pg   MCHC 33.0 32.3 - 36.5 gm/dL   Plt Ct 646 849 - 599 thou/mcL   RDW SD 37.1 35.1 - 46.3 fL   MPV 10.5 9.4 - 12.4 fL   NRBC% 0.0 0.0 - 0.2 /100WBC   Absolute NRBC Count 0.00 0.00 - 0.01 thou/mcL   NEUTROPHIL % 69.4 %   LYMPHOCYTE % 18.9 %   MONOCYTE % 9.4 %   Eosinophil % 1.3 %   BASOPHIL % 0.4 %   IG% 0.6 %   ABSOLUTE NEUTROPHIL COUNT 10.97 (H) 1.50 - 7.50 thou/mcL   ABSOLUTE LYMPHOCYTE COUNT 2.98 1.00 - 4.50 thou/mcL   Absolute Monocyte Count 1.48 (H) 0.10 - 0.80 thou/mcL   Absolute Eosinophil Count 0.20 0.00 - 0.50 thou/mcL   Absolute Basophil Count 0.07 0.00 - 0.20 thou/mcL   Absolute Immature Granulocyte Count 0.09 (H) 0.00 - 0.03 thou/mcL   Comprehensive Metabolic Panel   Collection Time: 08/12/24  6:25 PM  Result Value Ref Range   Na 139 136 - 146 mmol/L   Potassium 3.9 3.7 - 5.4 mmol/L   Cl 99 97 - 108 mmol/L   CO2 21 20 - 32 mmol/L   AGAP 19 (H) 7 - 16 mmol/L   Glucose 119 (H) 65 - 99 mg/dL   BUN 14 6 - 20 mg/dL   Creatinine 9.00 9.23 - 1.27  mg/dL   Ca 89.8 8.7 - 89.7 mg/dL   ALK PHOS 698 (H) 25 - 150 U/L   T Bili 3.1 (H) 0.0 - 1.2 mg/dL   Total Protein 8.8 (H) 6.0 - 8.5 gm/dL   Alb 4.9 3.5 - 5.5 gm/dL   GLOBULIN 3.9 1.5 - 4.5 gm/dL   ALBUMIN/GLOBULIN RATIO 1.3 1.1 - 2.5   BUN/CREAT RATIO 14.1 11.0 - 26.0   ALT 604 (H) 0 - 55 U/L   AST 289 (H) 0 - 40 U/L   eGFR 109 >=60 mL/min/1.24m2  Lactic Acid - Yes reflex 2 hour   Collection Time: 08/12/24  6:25 PM  Result Value Ref Range   Lactic Acid 1.8 0.5 - 2.0 mmol/L  Lipase   Collection Time: 08/12/24  6:25 PM  Result Value Ref Range   Lipase >3,000 (H) 0 - 59 U/L  Gen5 Cardiac Troponin T (TnT5) 1H   Collection Time: 08/12/24  7:27 PM  Result Value Ref Range   TnT-Gen5 (1hr) 7 <22 ng/L   Delta 1 Hour 1 <5 ng/L  ECG 12 lead   Collection Time: 08/12/24  7:45 PM  Result Value Ref Range   Acquisition Device D3K    Ventricular Rate 79 BPM   Atrial Rate 79 BPM   P-R Interval 168 ms   QRS Duration 108 ms   Q-T Interval 380 ms   QTC Calculation(Bazett) 435 ms   Calculated P Axis 58 degrees   Calculated R Axis 69 degrees   Calculated T Axis 61 degrees   ECG Diagnosis  Normal sinus rhythm Normal ECG When compared with ECG of 12-Aug-2024 17:39, No significant change was found   Urinalysis w/ Reflex Microscopic; Reflex to Culture - Symptomatic   Collection Time: 08/12/24  8:54 PM  Result Value Ref Range   Urine Color Yellow Yellow    Urine Clarity Clear Clear   Urine Specific Gravity 1.032 (H) 1.005 - 1.030   Urine pH 5.5 5 to 9   Urine Protein - Dipstick Negative Negative mg/dl   Urine Glucose Negative Negative mg/dL   Urine Ketones Negative Negative mg/dl   Urine Bilirubin Negative Negative mg/dL   Urine Blood Negative Negative mg/dL   Urine Nitrite Negative Negative   Urine Urobilinogen <2 <2 mg/dl   Urine Leukocyte Esterase Negative Negative Leu/mcL   UA Microscopic No Micro No Micro  Gen5 Cardiac Troponin T (TnT5) 3H   Collection Time: 08/12/24  9:55  PM  Result Value Ref Range   TnT-Gen5 (3hr) 6 <22 ng/L   Delta 3 Hour 0 <7 ng/L  ECG 12 lead   Collection Time: 08/12/24  9:57 PM  Result Value Ref Range   Acquisition Device D3K    Ventricular Rate 88 BPM   Atrial Rate 88 BPM   P-R Interval 156 ms   QRS Duration 106 ms   Q-T Interval 374 ms   QTC Calculation(Bazett) 452 ms   Calculated P Axis 62 degrees   Calculated R Axis 63 degrees   Calculated T Axis 47 degrees   ECG Diagnosis      Normal sinus rhythm Normal ECG When compared with ECG of 12-Aug-2024 19:45, No significant change was found    Imaging: XR Chest Ap Portable Result Date: 08/12/2024 TECHNIQUE: XR CHEST AP PORTABLE INDICATION: Chest Pain COMPARISON: None. FINDINGS: Support Devices: None. Cardiac/Mediastinum: Cardiomediastinal silhouette is within normal limits.  Lungs/Pleura: No focal airspace consolidation. No pleural effusion or pneumothorax. Additional findings: Unremarkable appearance of the upper abdomen.  No acute displaced fracture.   IMPRESSION: No evidence of acute cardiopulmonary abnormality. Electronically Signed by: Massie Bracket on 08/12/2024 7:25 PM  CT Chest Abdomen Pelvis W IV Contrast Result Date: 08/12/2024 INDICATION: Pain TECHNIQUE: Multiple contiguous helical slices were obtained through the chest, abdomen, and pelvis following the IV administration of 75 cc of Isovue-370. Radiation dose reduction was utilized (automated exposure control, mA or kV adjustment based on patient size, or iterative image reconstruction). COMPARISON:  None available. FINDINGS: CHEST: -Lungs:  Patent central airways. No consolidation. -Pleura:  No pleural effusion or pneumothorax. -Adenopathy:  None in axillary, hilar, or mediastinal regions. -Vacular: No coronary artery calcifications or pericardial effusion. -Other: Unremarkable thyroid  gland. Soft tissue within the anterior mediastinum likely reflecting residual thymus. Small hiatal hernia with distal esophageal wall  thickening. ABDOMEN: -Liver:  Normal. -Biliary:Mildly distended gallbladder with surrounding inflammation. -Spleen:  Normal. -Pancreas:  Diffuse peripancreatic stranding present. Nonspecific calcifications about the head and uncinate process. -Adrenal glands:  Normal. -Kidney:  Small right renal cyst not warranting imaging follow-up. -Lymphadenopathy: Shotty upper abdominal and retroperitoneal lymph nodes noted. -Vascular: Unremarkable. -Fluid: No free fluid or fluid collection. -Other:None. PELVIS: -Small bowel:  Wall thickening and inflammation involving the duodenal sweep. -Colon:  Mild wall thickening and inflammation involving the ascending colon. Grossly unremarkable appendix. -Fluid:  No free fluid or fluid collection. -Vascular: No significant abnormality. -Lymphadenopathy: Mildly enlarged mesenteric lymph nodes within the right abdomen. -Other:Unremarkable urinary bladder and prostate. OSSEOUS STRUCTURES: -No significant acute abnormality.   IMPRESSION: 1. Acute pancreatitis. 2. Distended gallbladder with surrounding inflammation  may reflect concomitant cholecystitis. Small calcifications are noted about the head of the pancreas, nonspecific, choledocholithiasis not entirely excluded. 3. Wall thickening and inflammation involving the duodenal sweep, likely reactive. 4. Wall thickening involving the ascending colon, potentially reflecting mild colitis. 5. Additional findings as detailed above. Electronically Signed by: Bernard LULLA Blanch MD, MBA on 08/12/2024 8:40 PM  US  RUQ Result Date: 08/12/2024 INDICATION: Elevated LFT's Right Upper Quadrant Pain TECHNIQUE: US  RUQ.  Exam date/time: 08/12/2024 9:10 PM.  Comparison none FINDINGS: visualized aorta, inferior vena cava are unremarkable. Pancreas not seen due to overlying bowel gas. Liver measures 17.8 cm. No liver lesions. Distended gallbladder with sludge. Normal gallbladder wall thickness. Common bile duct normal at 4 mm. Right kidney measures 11.1 cm. No  hydronephrosis..   IMPRESSION: 1. Gallbladder sludge Electronically Signed by: Glendia Guillaume, MD on 08/12/2024 10:04 PM  ECG: No results found.  Coding  Electronically signed: Yancy Sorrel, MD 08/12/2024 / 11:38 PM       [1] No Known Allergies [2] Social History Socioeconomic History   Marital status: Married  [3] No family history on file.

## 2024-08-14 NOTE — Anesthesia Preprocedure Evaluation (Addendum)
" °  Anesthesia ROS/Med History   Patient summary reviewed Anesthesia History Patient Has Had Previous Anesthesia (-) Anesthesia Complications Neuro/Psych Patient has a Negative Neuro/Psych ROS  (-) Seizures (-) CVA (-) TIA Pulmonary  (+) Asthma, severity of Mild   (-) COPD (-) Obstructive Sleep Apnea (OSA) Cardiovascular  Patient has a  Negative Cardiovascular ROS  (-) Hypertension (HTN) (-) Arrhythmia GI/Hepatic  (+) Gastroesophageal Reflux Disease (GERD), control status: Well-Controlled Renal/Urologic Patient has a Negative Renal/Urologic ROS (-) Chronic Kidney Disease (CKD) Hematology  Patient has a Negative Hematology ROS  Endocrine/Cancer/Other  (+) Obesity Status: Obese - BMI 30-40 Dental  Patient Has: No Notable Dental Hx    Other Review of Systems findings: Acute pancreatitis secondary to choledocholithiasis/acute cholecystitis        Physical Exam  Airway Mallampati: II     Cardiovascular  Rhythm: regular   Dental - normal exam  Pulmonary Breath sounds clear to auscultation   Abdominal   Neuro/Psych         Anesthesia Plan (Medical history, labs, and medications reviewed in EMR.  Patient immediately reassessed prior to anesthesia.  I have confirmed NPO status.   Anesthesia plan discussed with patient.  Questions elicited and answered.    MM: tylenol  and decadron  AE: decadron  and zofran   )Anesthetic plan and risks discussed with patient. ASA 2   general   Plan discussed with CRNA.  intravenous induction Anesthesia Special needs:  ETT                     BP: (P) 131/77 (08/14/24 0733) Heart Rate: (P) 87 (08/14/24 0733) Resp: (P) 14 (08/14/24 0733) Temp: (P) 100.6 F (38.1 C) (08/14/24 0733) SpO2: (P) 93 % (08/14/24 0733)       "

## 2024-08-14 NOTE — Care Plan (Signed)
" °  Problem: Discharge Planning Goal: Knowledge of medical problems (What is my main problem?) Outcome: Progressing Goal: Knowledge of self care (What do I need to do when I go home?) Outcome: Progressing Goal: Knowledge of treatment plan (Why is it important for me to do this?) Outcome: Progressing Goal: Knowledge of medication management Outcome: Progressing   Problem: Injury Risk, Abnormal Glucose Level Goal: Glucose level within specified parameters Outcome: Progressing   Problem: Sensory Perception - Impaired Goal: Absence of physical injury Outcome: Progressing   Problem: Pain - Acute Goal: Reduced pain sensation Outcome: Progressing   Problem: Deep Venous Thrombosis, Risk of Goal: Absence of deep venous thrombosis Outcome: Progressing   Problem: Bleeding, Risk of Goal: Absence of active bleeding Outcome: Progressing Goal: Absence of impaired coagulation signs and symptoms Outcome: Progressing   Problem: Fall Prevention Goal: Absence of falls Outcome: Progressing   Problem: Infection Risk, Surgical Site Goal: Absence of infection signs and symptoms Outcome: Progressing   "

## 2024-08-15 NOTE — Discharge Summary (Signed)
 [facility]OSPITALISTS Discharge Summary  Devin Robinson FMW:[id number] DOB: 25-Feb-2001  PCP: Mabel Pry  Admit date: 08/12/2024 Discharge date: 08/15/2024  Time spent: 25 minutes  Recommendations for Outpatient Follow-up:  Patient will follow-up with general surgery in 2 to 3 weeks. Patient is not to lift anything heavy over 10 pounds for the next 2 weeks New medication: Zofran  4 mg as needed for nausea New medication: Oxy IR 5 mg every 6 hours as needed for the next 5 days   Discharge Condition: Improved, being discharged home  Diet recommendation: Low-fat diet  Vitals:   08/15/24 0716  BP: 127/78  Pulse: 85  Resp: 16  Temp: 98.8 F (37.1 C)  SpO2: 93%    History of present illness:  23 year old with past medical history of obesity who presented to the emergency room on 12/28 with complaints of 1 week of midepigastric abdominal pain along with some nausea and vomiting. Lab work noteworthy of lipase greater than 3000, elevated alkaline phosphatase and transaminases and CT scan of abdomen pelvis noted acute pancreatitis, distended gallbladder concerning for common bile duct obstruction/acute cholecystitis. Patient seen by general surgery and MRCP has been ordered.    Hospital Course:  Principal Problem:   Acute pancreatitis secondary to choledocholithiasis/acute cholecystitis: Patient made n.p.o. and started on IV fluids.  Symptomatic treatment with medication for pain and nausea.  MRCP done 12/29 and found to be unrevealing.  Discussed with general surgery.  Lipase level came down markedly to less than 500.  Patient taken by general surgery for laparoscopic cholecystectomy on late morning of 12/30.  Gallbladder removed without incident.  Patient started on clear liquids which she tolerated well.  By 12/31 cleared for discharge from surgical standpoint.  Outpatient follow-up in 2 weeks.  Medication given for pain and nausea on discharge   Obesity: Meets criteria for BMI  greater than 30   Consultants: General Surgery   Procedures: Status post MRCP Status post laparoscopic cholecystectomy  Discharge Exam: BP 127/78 (BP Location: Left Upper Arm, Patient Position: Lying)   Pulse 85   Temp 98.8 F (37.1 C) (Oral)   Resp 16   Ht 1.778 m (5' 10)   Wt 108.9 kg (240 lb)   SpO2 93%   BMI 34.44 kg/m   General: Alert and oriented x 3, no acute distress Cardiovascular: Regular rate and rhythm, S1-S2  Discharge Instructions You were cared for by a hospitalist during your hospital stay. If you have any questions about your discharge medications or the care you received while you were in the hospital after you are discharged, you can call the unit and asked to speak with the hospitalist on call if the hospitalist that took care of you is not available. Once you are discharged, your primary care physician will handle any further medical issues. Please note that NO REFILLS for any discharge medications will be authorized once you are discharged, as it is imperative that you return to your primary care physician (or establish a relationship with a primary care physician if you do not have one) for your aftercare needs so that they can reassess your need for medications and monitor your lab values.     Medication List     START taking these medications    ondansetron  4 mg tablet Commonly known as: ZOFRAN  Take one tablet (4 mg dose) by mouth daily as needed for Nausea.   oxyCODONE HCl 5 mg immediate release tablet Commonly known as: ROXICODONE Take one tablet (5 mg dose) by  mouth every 6 (six) hours as needed for up to 5 days. Max Daily Amount: 20 mg       CONTINUE taking these medications    BREZTRI AEROSPHERE 160-9-4.8 MCG/ACT Aero inhaler Generic drug: budeson-glycopyrrol-formoterol   ibuprofen  600 mg tablet Commonly known as: ADVIL ,MOTRIN    levalbuterol  45 MCG/ACT inhaler Commonly known as: XOPENEX  HFA   QVAR  REDIHALER 40 MCG/ACT  inhaler Generic drug: beclomethasone HFA       ASK your doctor about these medications    omeprazole  20 mg capsule Commonly known as: PRILOSEC   ondansetron  8 mg disintegrating tablet Commonly known as: ZOFRAN -ODT         Where to Get Your Medications     These medications were sent to [id number] - [place], [place] - [address]  [address], [place]  [address]    Phone: [phone]  ondansetron  4 mg tablet oxyCODONE HCl 5 mg immediate release tablet    Allergies[1]   The results of significant diagnostics from this hospitalization (including imaging, microbiology, ancillary and laboratory) are listed below for reference.    Significant Diagnostic Studies: XR Cholangiogram Intraoperative Result Date: 08/14/2024 INDICATION: cholangiogram. COMPARISON: 08/13/2024 TECHNIQUE: Fluoroscopy for intraoperative cholangiogram FINDINGS: Images demonstrate intraoperative angiogram with normal biliary tree. No filling defects. Cumulative Air Kerma (mGy): 3.1   IMPRESSION: 1.  Intraoperative angiogram  Electronically Signed by: Sonny Appl, MD on 08/14/2024 1:18 PM  CHOLECYSTECTOMY LAPAROSCOPIC Result Date: 08/14/2024 This order was auto-finalized. Please see relevant op note for procedure result.  MRI Abdomen MRCP WO W Contrast Result Date: 08/13/2024 MRI ABDOMEN WITH AND WITHOUT CONTRAST, WITH MRCP INDICATION: evalaute for choledocholithiasis COMPARISON:  CT 08/12/2024. Ultrasound 08/12/2024 TECHNIQUE: Multisequence, multiplanar precontrast and dynamic postcontrast MR imaging of the abdomen was performed. MRCP was also performed, with 3-D reconstruction performed to evaluate biliary anatomy. 10 mL of GADOBUTROL 1 MMOL/ML IV SOLN was administered intravenously. FINDINGS: - Lower Thorax: Minimal dependent atelectasis in the bilateral lower lobes. - Liver: Normal liver contour and signal. No liver lesions. The hepatic veins and portal veins are patent. -  Biliary Tree and Gallbladder: Gallbladder sludge seen on the prior ultrasound is not well seen on the current study. No definite gallstones. There is no intrahepatic or extrahepatic biliary ductal dilation. No filling defect identified within the common bile duct. - Pancreas: There is diffuse pancreatic and peripancreatic edema. There is no well-defined fluid collection. No pancreatic ductal dilation. - Spleen: Mildly enlarged measuring up to 13.9 cm in greatest dimension. - Adrenal Glands: Normal. - Kidneys: No hydronephrosis in either kidney. Tiny simple appearing right renal cysts.  - Gastrointestinal Tract: The visualized portions of the bowel are not obstructed.  - Peritoneal Cavity and Retroperitoneum: Retroperitoneal edema extending into the paracolic gutters. - Lymph Nodes: Multiple small lymph nodes noted throughout the retroperitoneum, but without pathologic enlargement. These are likely reactive. - Vasculature: The abdominal aorta is normal in caliber. - Musculoskeletal: No acute or aggressive bony abnormality. - Miscellaneous: N/A   IMPRESSION: 1.  There is no biliary ductal dilation or choledocholithiasis. 2.  Acute, interstitial edematous pancreatitis. 3.  Mild splenomegaly. Electronically Signed by: Ozell JONETTA Cordial, MD on 08/13/2024 3:32 PM  US  RUQ Result Date: 08/12/2024 INDICATION: Elevated LFT's Right Upper Quadrant Pain TECHNIQUE: US  RUQ.  Exam date/time: 08/12/2024 9:10 PM.  Comparison none FINDINGS: visualized aorta, inferior vena cava are unremarkable. Pancreas not seen due to overlying bowel gas. Liver measures 17.8 cm. No liver lesions. Distended gallbladder with sludge. Normal gallbladder wall thickness.  Common bile duct normal at 4 mm. Right kidney measures 11.1 cm. No hydronephrosis..   IMPRESSION: 1. Gallbladder sludge Electronically Signed by: Glendia Guillaume, MD on 08/12/2024 10:04 PM  CT Chest Abdomen Pelvis W IV Contrast Result Date: 08/12/2024 INDICATION: Pain  TECHNIQUE: Multiple contiguous helical slices were obtained through the chest, abdomen, and pelvis following the IV administration of 75 cc of Isovue-370. Radiation dose reduction was utilized (automated exposure control, mA or kV adjustment based on patient size, or iterative image reconstruction). COMPARISON:  None available. FINDINGS: CHEST: -Lungs:  Patent central airways. No consolidation. -Pleura:  No pleural effusion or pneumothorax. -Adenopathy:  None in axillary, hilar, or mediastinal regions. -Vacular: No coronary artery calcifications or pericardial effusion. -Other: Unremarkable thyroid  gland. Soft tissue within the anterior mediastinum likely reflecting residual thymus. Small hiatal hernia with distal esophageal wall thickening. ABDOMEN: -Liver:  Normal. -Biliary:Mildly distended gallbladder with surrounding inflammation. -Spleen:  Normal. -Pancreas:  Diffuse peripancreatic stranding present. Nonspecific calcifications about the head and uncinate process. -Adrenal glands:  Normal. -Kidney:  Small right renal cyst not warranting imaging follow-up. -Lymphadenopathy: Shotty upper abdominal and retroperitoneal lymph nodes noted. -Vascular: Unremarkable. -Fluid: No free fluid or fluid collection. -Other:None. PELVIS: -Small bowel:  Wall thickening and inflammation involving the duodenal sweep. -Colon:  Mild wall thickening and inflammation involving the ascending colon. Grossly unremarkable appendix. -Fluid:  No free fluid or fluid collection. -Vascular: No significant abnormality. -Lymphadenopathy: Mildly enlarged mesenteric lymph nodes within the right abdomen. -Other:Unremarkable urinary bladder and prostate. OSSEOUS STRUCTURES: -No significant acute abnormality.   IMPRESSION: 1. Acute pancreatitis. 2. Distended gallbladder with surrounding inflammation may reflect concomitant cholecystitis. Small calcifications are noted about the head of the pancreas, nonspecific, choledocholithiasis not entirely  excluded. 3. Wall thickening and inflammation involving the duodenal sweep, likely reactive. 4. Wall thickening involving the ascending colon, potentially reflecting mild colitis. 5. Additional findings as detailed above. Electronically Signed by: Bernard LULLA Blanch MD, MBA on 08/12/2024 8:40 PM  XR Chest Ap Portable Result Date: 08/12/2024 TECHNIQUE: XR CHEST AP PORTABLE INDICATION: Chest Pain COMPARISON: None. FINDINGS: Support Devices: None. Cardiac/Mediastinum: Cardiomediastinal silhouette is within normal limits.  Lungs/Pleura: No focal airspace consolidation. No pleural effusion or pneumothorax. Additional findings: Unremarkable appearance of the upper abdomen.  No acute displaced fracture.   IMPRESSION: No evidence of acute cardiopulmonary abnormality. Electronically Signed by: Massie Bracket on 08/12/2024 7:25 PM  CT Abdomen Pelvis W IV Contrast Result Date: 08/05/2024 EXAM: CT ABDOMEN AND PELVIS WITH CONTRAST 08/05/2024 04:51:00 AM TECHNIQUE: CT of the abdomen and pelvis was performed with the administration of 100 mL of iohexol  (OMNIPAQUE ) 300 MG/ML solution. Multiplanar reformatted images are provided for review. Automated exposure control, iterative reconstruction, and/or weight-based adjustment of the mA/kV was utilized to reduce the radiation dose to as low as reasonably achievable. COMPARISON: None available. CLINICAL HISTORY: Polytrauma, blunt. FINDINGS: LOWER CHEST: No acute abnormality. LIVER: The liver is unremarkable. GALLBLADDER AND BILE DUCTS: Gallbladder is unremarkable. No biliary ductal dilatation. SPLEEN: No acute abnormality. PANCREAS: No acute abnormality. ADRENAL GLANDS: No acute abnormality. KIDNEYS, URETERS AND BLADDER: No stones in the kidneys or ureters. No hydronephrosis. No perinephric or periureteral stranding. Urinary bladder is unremarkable. GI AND BOWEL: Stomach demonstrates no acute abnormality. There is no bowel obstruction. PERITONEUM AND RETROPERITONEUM: No ascites. No  free air. VASCULATURE: Aorta is normal in caliber. LYMPH NODES: There are mildly prominent mesenteric and retroperitoneal lymph nodes on the right, which may be related to the patient's ulcerative colitis. REPRODUCTIVE ORGANS: No acute abnormality. BONES  AND SOFT TISSUES: No acute osseous abnormality. No focal soft tissue abnormality. There is no evidence of acute traumatic injury. IMPRESSION: 1. No evidence of acute traumatic injury. 2. Mildly prominent right mesenteric and retroperitoneal lymph nodes, possibly related to the patient's ulcerative colitis. Electronically signed by: Evalene Coho MD 08/05/2024 05:10 AM EST RP Workstation: [id number]   Microbiology: @[email or web address] @   Labs: Basic Metabolic Panel: Recent Labs    Units 08/15/24 0358 08/14/24 0750 08/14/24 0344 08/14/24 0328 08/14/24 0003 08/13/24 0559 08/13/24 0207 08/13/24 0019 08/12/24 1825  NA mmol/L 138  --   --  138  --   --  137  --  139  K mmol/L 3.6*  --   --  3.6*  --   --  3.7  --  3.9  CL mmol/L 100  --   --  100  --   --  99  --  99  CO2 mmol/L 28  --   --  29  --   --  21  --  21  GLUCOSE mg/dL 99 885* 877* 883* 877*   < > 137*   < > 119*  BUN mg/dL 6  --   --  8  --   --  15  --  14  CREATININE mg/dL 9.11  --   --  9.01  --   --  0.96  --  0.99  CALCIUM mg/dL 9.0  --   --  9.0  --   --  9.6  --  10.1   < > = values in this interval not displayed.   Liver Function Tests: Recent Labs    Units 08/15/24 0358 08/14/24 0328 08/13/24 0207 08/12/24 1825  AST U/L 75* 94* 208* 289*  ALT U/L 242* 336* 509* 604*  ALKPHOS U/L 157* 180* 245* 301*  BILITOT mg/dL 0.8 1.0 1.4* 3.1*  ALBUMIN gm/dL 3.9 4.1 4.5 4.9   Recent Labs    Units 08/15/24 0358 08/14/24 0328 08/12/24 1825  LIPASE U/L 62* 406* >3,000*   No results for input(s): AMMONIA in the last 168 hours. CBC: Recent Labs    Units 08/15/24 0358 08/14/24 0328 08/13/24 0206 08/12/24 1825  WBC thou/mcL 9.8 9.0 9.3 15.8*  NEUTROABS thou/mcL  --    --   --  10.97*  HGB gm/dL 89.9* 89.1* 87.3* 85.9  HCT % 31.0* 33.8* 37.6* 42.4  MCV fL 84.0 85.1 83.6 81.9  PLT thou/mcL 218 206 242 353   Cardiac Enzymes: No results for input(s): CKMB, TROPONINI in the last 168 hours.  Invalid input(s): CKTOTAL, CKMBINDEX BNP: No results found for: BNP  CBG: Invalid input(s): GLUCAP     Signed:  Sendil Krishnan, MD 08/15/2024, 12:15 PM        [1] No Known Allergies

## 2024-08-15 NOTE — Progress Notes (Signed)
 GENERAL SURGERY PROGRESS NOTE  Assessment: S/p laparoscopic cholecystectomy with intraoperative cholangiogram Lipase is improved significantly at 62 today  Plan: Home when medically stable Continue DVT and GI prophylaxis We will sign off for now.  Please contact us  for questions or for or return.  I have placed discharge instructions, sent medications for pain and nausea as needed, and requested a 2 to 3-week follow-up visit with our office.  Subjective:  No acute events overnight.  The patient notes incisional pain.  Objective: BP 127/78 (BP Location: Left Upper Arm, Patient Position: Lying)   Pulse 85   Temp 98.8 F (37.1 C) (Oral)   Resp 16   Ht 5' 10 (1.778 m)   Wt 240 lb (108.9 kg)   SpO2 93%   BMI 34.44 kg/m  Gen: NAD Neuro: Alert and oriented x3 Abdomen: Soft, nondistended.  Tenderness to palpation at the incision sites.  No significant tenderness to palpation of the left side of the abdomen.  Scant sanguinous drainage at the umbilical incision. Extremities: No clubbing, cyanosis, or edema; soft  Labs: Lab Results  Component Value Date   WBC 9.8 08/15/2024   HGB 10.0 (L) 08/15/2024   HCT 31.0 (L) 08/15/2024   Plt Ct 218 08/15/2024    Lab Results  Component Value Date   Na 138 08/15/2024   Potassium 3.6 (L) 08/15/2024   Cl 100 08/15/2024   CO2 28 08/15/2024   BUN 6 08/15/2024   Creatinine 0.88 08/15/2024   Ca 9.0 08/15/2024    Lab Results  Component Value Date   T Bili 0.8 08/15/2024   D Bili 0.5 (H) 08/14/2024   Alb 3.9 08/15/2024   ALT 242 (H) 08/15/2024   AST 75 (H) 08/15/2024   ALK PHOS 157 (H) 08/15/2024    No results found for: LABPROT, INR, APTT  Problem List: Problem List[1]     Past Medical History History reviewed. No pertinent past medical history.  Past Surgical History History reviewed. No pertinent surgical history.  Medications Current Medications[2]  Allergies Patient has no known allergies.   Signed, Lauraine DELENA Piety, PA-C  08/15/2024, 9:03 AM       [1] Patient Active Problem List Diagnosis   Routine child health maintenance   Acute acalculous cholecystitis   Acute pancreatitis without infection or necrosis (*)   Acute cholecystitis  [2] Current Facility-Administered Medications  Medication Dose Route Frequency Provider Last Rate Last Admin   dextrose (GLUTOSE/SWEET CHEEKS) oral gel 15-30 g of glucose  15-30 g of glucose Oral PRN Laymon Almarie Gentry, PA-C       Or   dextrose injection 12.5 g  12.5 g IntraVENous PRN Laymon Almarie Gentry, PA-C       Or   dextrose injection 25 g  25 g IntraVENous PRN Laymon Almarie Gentry, PA-C       Or   glucagon injection 1 mg  1 mg Intramuscular PRN Laymon Almarie Gentry, PA-C       heparin injection 5,000 Units  5,000 Units Subcutaneous ONCE Brittany Elizabeth Shaw, PA-C       HYDROmorphone (DILAUDID) injection 0.5 mg  0.5 mg IntraVENous Q3H PRN Laymon Almarie Gentry, PA-C   0.5 mg at 08/15/24 0734   LR infusion  50 mL/hr IntraVENous Continuous Laymon Almarie Gentry, PA-C 50 mL/hr at 08/14/24 1802 50 mL/hr at 08/14/24 1802   melatonin tablet 2 mg  2 mg Oral HS PRN Laymon Almarie Gentry, PA-C   2 mg at 08/15/24 [phone]  naloxone (NARCAN) injection 0.4 mg  0.4 mg IntraVENous PRN Laymon Almarie Gentry, PA-C       ondansetron  (ZOFRAN ) injection 4 mg  4 mg IntraVENous Q8H PRN Laymon Almarie Gentry, PA-C   4 mg at 08/14/24 0343   oxyCODONE HCl (ROXICODONE) immediate release tablet 5 mg  5 mg Oral Q6H PRN Laymon Almarie Gentry, PA-C   5 mg at 08/15/24 0535   piperacillin-tazobactam (ZOSYN) 4.5 g in NaCl 0.9% 100 mL addEASE  4.5 g IntraVENous Q8H SCH [place], PA-C 25 mL/hr at 08/15/24 0436 4.5 g at 08/15/24 0436   polyethylene glycol (MIRALAX) packet 17 g  17 g Oral Daily PRN Laymon Almarie Gentry, PA-C       prochlorperazine (COMPAZINE) injection 10 mg  10 mg IntraVENous Q6H PRN Laymon Almarie Gentry,  PA-C   10 mg at 08/15/24 [phone]

## 2024-08-17 ENCOUNTER — Telehealth: Payer: Self-pay

## 2024-08-17 NOTE — Transitions of Care (Post Inpatient/ED Visit) (Signed)
" ° °  08/17/2024  Name: Devin Robinson MRN: [id number] DOB: [birth year]  Today's TOC FU Call Status: Today's TOC FU Call Status:: Successful TOC FU Call Completed TOC FU Call Complete Date: 08/17/24  Patient's Name and Date of Birth confirmed. Name, DOB  Transition Care Management Follow-up Telephone Call Date of Discharge: 08/15/24 Discharge Facility: Other [facility]) Name of Other (Non-[facility]) Discharge Facility: [facility] Type of Discharge: Inpatient Admission Primary Inpatient Discharge Diagnosis:: Acute pancreatitis secondary to choledocholithiasis/acute cholecystitis How have you been since you were released from the hospital?: Better Any questions or concerns?: No  Items Reviewed: Did you receive and understand the discharge instructions provided?: Yes Medications obtained,verified, and reconciled?: No Medications Not Reviewed Reasons:: Other: (Patient answered call and stated he understood discharge instructions and had obtained all prescribed medications and did not have any questions or concens, declining full medication review and full TOC follow up call items.) Any new allergies since your discharge?: No  Medications Reviewed Today: Patient answered call and stated he understood discharge instructions and had obtained all prescribed medications and did not have any questions or concens, declining full medication review and full TOC follow up call items.  Medications Reviewed Today   Medications were not reviewed in this encounter    08/17/2024: Patient answered call, RN CM identified self and reason for call.   Patient stated he understood discharge instructions and had obtained all prescribed medications and did not have any questions or concens, declining full medication review and full TOC follow up call items.     Devin Edison MSN, RN RN [employer] [facility]  [facility] Office Hours M-F [phone] Direct Dial:  [phone] Main Phone [phone]  Fax: [phone] [email or web address]       "
